# Patient Record
Sex: Male | Born: 1965 | Race: Black or African American | Hispanic: No | Smoking: Former smoker
Health system: Southern US, Community
[De-identification: ages and names within clinical notes are randomized; demographics above are authoritative.]

## PROBLEM LIST (undated history)

## (undated) DIAGNOSIS — K219 Gastro-esophageal reflux disease without esophagitis: Secondary | ICD-10-CM

## (undated) DIAGNOSIS — J329 Chronic sinusitis, unspecified: Secondary | ICD-10-CM

## (undated) DIAGNOSIS — J45909 Unspecified asthma, uncomplicated: Secondary | ICD-10-CM

## (undated) DIAGNOSIS — I10 Essential (primary) hypertension: Secondary | ICD-10-CM

## (undated) DIAGNOSIS — J9600 Acute respiratory failure, unspecified whether with hypoxia or hypercapnia: Secondary | ICD-10-CM

## (undated) DIAGNOSIS — R031 Nonspecific low blood-pressure reading: Secondary | ICD-10-CM

## (undated) DIAGNOSIS — R0602 Shortness of breath: Secondary | ICD-10-CM

## (undated) DIAGNOSIS — Z9889 Other specified postprocedural states: Secondary | ICD-10-CM

## (undated) HISTORY — DX: Chronic sinusitis, unspecified: J32.9

## (undated) HISTORY — PX: KNEE ARTHROSCOPY: SUR90

## (undated) HISTORY — PX: VASECTOMY: SHX75

## (undated) HISTORY — DX: Essential (primary) hypertension: I10

## (undated) HISTORY — DX: Nonspecific low blood-pressure reading: R03.1

## (undated) HISTORY — PX: ANTERIOR CRUCIATE LIGAMENT REPAIR: SHX115

## (undated) HISTORY — DX: Acute respiratory failure, unspecified whether with hypoxia or hypercapnia: J96.00

## (undated) HISTORY — DX: Unspecified asthma, uncomplicated: J45.909

---

## 1996-11-26 HISTORY — PX: NASAL SEPTUM SURGERY: SHX37

## 1998-02-24 ENCOUNTER — Encounter (HOSPITAL_COMMUNITY): Admission: RE | Admit: 1998-02-24 | Discharge: 1998-05-25 | Payer: Self-pay | Admitting: Psychiatry

## 1998-03-11 ENCOUNTER — Emergency Department (HOSPITAL_COMMUNITY): Admission: EM | Admit: 1998-03-11 | Discharge: 1998-03-11 | Payer: Self-pay | Admitting: Emergency Medicine

## 1998-03-13 ENCOUNTER — Emergency Department (HOSPITAL_COMMUNITY): Admission: EM | Admit: 1998-03-13 | Discharge: 1998-03-13 | Payer: Self-pay | Admitting: Emergency Medicine

## 1998-05-04 ENCOUNTER — Emergency Department (HOSPITAL_COMMUNITY): Admission: EM | Admit: 1998-05-04 | Discharge: 1998-05-04 | Payer: Self-pay | Admitting: Emergency Medicine

## 1998-05-12 ENCOUNTER — Emergency Department (HOSPITAL_COMMUNITY): Admission: EM | Admit: 1998-05-12 | Discharge: 1998-05-12 | Payer: Self-pay | Admitting: Emergency Medicine

## 1998-10-18 ENCOUNTER — Emergency Department (HOSPITAL_COMMUNITY): Admission: EM | Admit: 1998-10-18 | Discharge: 1998-10-18 | Payer: Self-pay | Admitting: Emergency Medicine

## 1999-01-04 ENCOUNTER — Emergency Department (HOSPITAL_COMMUNITY): Admission: EM | Admit: 1999-01-04 | Discharge: 1999-01-04 | Payer: Self-pay | Admitting: Emergency Medicine

## 1999-01-18 ENCOUNTER — Emergency Department (HOSPITAL_COMMUNITY): Admission: EM | Admit: 1999-01-18 | Discharge: 1999-01-18 | Payer: Self-pay | Admitting: Emergency Medicine

## 1999-04-06 ENCOUNTER — Emergency Department (HOSPITAL_COMMUNITY): Admission: EM | Admit: 1999-04-06 | Discharge: 1999-04-06 | Payer: Self-pay | Admitting: Emergency Medicine

## 1999-04-06 ENCOUNTER — Encounter: Payer: Self-pay | Admitting: Emergency Medicine

## 1999-08-26 ENCOUNTER — Encounter: Payer: Self-pay | Admitting: Emergency Medicine

## 1999-08-26 ENCOUNTER — Emergency Department (HOSPITAL_COMMUNITY): Admission: EM | Admit: 1999-08-26 | Discharge: 1999-08-26 | Payer: Self-pay | Admitting: Emergency Medicine

## 1999-10-16 ENCOUNTER — Ambulatory Visit: Admission: RE | Admit: 1999-10-16 | Discharge: 1999-10-16 | Payer: Self-pay | Admitting: Pulmonary Disease

## 1999-11-01 ENCOUNTER — Encounter: Payer: Self-pay | Admitting: Emergency Medicine

## 1999-11-01 ENCOUNTER — Inpatient Hospital Stay (HOSPITAL_COMMUNITY): Admission: EM | Admit: 1999-11-01 | Discharge: 1999-11-02 | Payer: Self-pay | Admitting: Emergency Medicine

## 1999-12-01 ENCOUNTER — Inpatient Hospital Stay (HOSPITAL_COMMUNITY): Admission: EM | Admit: 1999-12-01 | Discharge: 1999-12-03 | Payer: Self-pay | Admitting: *Deleted

## 1999-12-01 ENCOUNTER — Encounter: Payer: Self-pay | Admitting: Pulmonary Disease

## 2000-03-30 ENCOUNTER — Emergency Department (HOSPITAL_COMMUNITY): Admission: EM | Admit: 2000-03-30 | Discharge: 2000-03-30 | Payer: Self-pay | Admitting: Emergency Medicine

## 2000-03-30 ENCOUNTER — Encounter: Payer: Self-pay | Admitting: Emergency Medicine

## 2000-08-22 ENCOUNTER — Emergency Department (HOSPITAL_COMMUNITY): Admission: EM | Admit: 2000-08-22 | Discharge: 2000-08-22 | Payer: Self-pay | Admitting: Emergency Medicine

## 2000-08-22 ENCOUNTER — Encounter: Payer: Self-pay | Admitting: Emergency Medicine

## 2000-09-21 ENCOUNTER — Emergency Department (HOSPITAL_COMMUNITY): Admission: EM | Admit: 2000-09-21 | Discharge: 2000-09-21 | Payer: Self-pay | Admitting: Emergency Medicine

## 2000-09-28 ENCOUNTER — Emergency Department (HOSPITAL_COMMUNITY): Admission: EM | Admit: 2000-09-28 | Discharge: 2000-09-28 | Payer: Self-pay | Admitting: Emergency Medicine

## 2001-04-29 ENCOUNTER — Emergency Department (HOSPITAL_COMMUNITY): Admission: EM | Admit: 2001-04-29 | Discharge: 2001-04-30 | Payer: Self-pay | Admitting: Podiatry

## 2001-11-26 HISTORY — PX: NASAL POLYP SURGERY: SHX186

## 2001-12-08 ENCOUNTER — Encounter: Payer: Self-pay | Admitting: Emergency Medicine

## 2001-12-08 ENCOUNTER — Inpatient Hospital Stay (HOSPITAL_COMMUNITY): Admission: EM | Admit: 2001-12-08 | Discharge: 2001-12-11 | Payer: Self-pay | Admitting: Emergency Medicine

## 2001-12-08 ENCOUNTER — Encounter: Payer: Self-pay | Admitting: *Deleted

## 2001-12-09 ENCOUNTER — Encounter: Payer: Self-pay | Admitting: Pulmonary Disease

## 2002-12-15 ENCOUNTER — Encounter: Payer: Self-pay | Admitting: Emergency Medicine

## 2002-12-15 ENCOUNTER — Inpatient Hospital Stay (HOSPITAL_COMMUNITY): Admission: EM | Admit: 2002-12-15 | Discharge: 2002-12-17 | Payer: Self-pay | Admitting: Emergency Medicine

## 2003-01-08 ENCOUNTER — Encounter: Payer: Self-pay | Admitting: Emergency Medicine

## 2003-01-08 ENCOUNTER — Inpatient Hospital Stay (HOSPITAL_COMMUNITY): Admission: EM | Admit: 2003-01-08 | Discharge: 2003-01-10 | Payer: Self-pay | Admitting: Emergency Medicine

## 2003-01-09 ENCOUNTER — Encounter: Payer: Self-pay | Admitting: Pulmonary Disease

## 2003-01-30 ENCOUNTER — Emergency Department (HOSPITAL_COMMUNITY): Admission: EM | Admit: 2003-01-30 | Discharge: 2003-01-31 | Payer: Self-pay | Admitting: Emergency Medicine

## 2003-01-30 ENCOUNTER — Encounter: Payer: Self-pay | Admitting: Emergency Medicine

## 2003-03-23 ENCOUNTER — Encounter: Payer: Self-pay | Admitting: Pulmonary Disease

## 2003-03-23 ENCOUNTER — Inpatient Hospital Stay (HOSPITAL_COMMUNITY): Admission: EM | Admit: 2003-03-23 | Discharge: 2003-03-25 | Payer: Self-pay | Admitting: Emergency Medicine

## 2003-07-06 ENCOUNTER — Encounter: Payer: Self-pay | Admitting: Emergency Medicine

## 2003-07-06 ENCOUNTER — Inpatient Hospital Stay (HOSPITAL_COMMUNITY): Admission: EM | Admit: 2003-07-06 | Discharge: 2003-07-09 | Payer: Self-pay | Admitting: Emergency Medicine

## 2003-09-30 ENCOUNTER — Observation Stay (HOSPITAL_COMMUNITY): Admission: EM | Admit: 2003-09-30 | Discharge: 2003-10-01 | Payer: Self-pay | Admitting: Emergency Medicine

## 2004-02-07 ENCOUNTER — Emergency Department (HOSPITAL_COMMUNITY): Admission: EM | Admit: 2004-02-07 | Discharge: 2004-02-07 | Payer: Self-pay

## 2005-04-16 ENCOUNTER — Observation Stay (HOSPITAL_COMMUNITY): Admission: EM | Admit: 2005-04-16 | Discharge: 2005-04-16 | Payer: Self-pay | Admitting: Emergency Medicine

## 2005-06-12 ENCOUNTER — Encounter: Admission: RE | Admit: 2005-06-12 | Discharge: 2005-06-12 | Payer: Self-pay | Admitting: Otolaryngology

## 2005-10-14 ENCOUNTER — Emergency Department (HOSPITAL_COMMUNITY): Admission: EM | Admit: 2005-10-14 | Discharge: 2005-10-14 | Payer: Self-pay | Admitting: Emergency Medicine

## 2006-11-03 ENCOUNTER — Emergency Department (HOSPITAL_COMMUNITY): Admission: EM | Admit: 2006-11-03 | Discharge: 2006-11-03 | Payer: Self-pay | Admitting: Emergency Medicine

## 2006-11-13 ENCOUNTER — Emergency Department (HOSPITAL_COMMUNITY): Admission: EM | Admit: 2006-11-13 | Discharge: 2006-11-14 | Payer: Self-pay | Admitting: Emergency Medicine

## 2007-01-18 ENCOUNTER — Inpatient Hospital Stay (HOSPITAL_COMMUNITY): Admission: EM | Admit: 2007-01-18 | Discharge: 2007-01-20 | Payer: Self-pay | Admitting: Emergency Medicine

## 2007-08-21 ENCOUNTER — Emergency Department (HOSPITAL_COMMUNITY): Admission: EM | Admit: 2007-08-21 | Discharge: 2007-08-21 | Payer: Self-pay | Admitting: Emergency Medicine

## 2008-06-03 DIAGNOSIS — J45909 Unspecified asthma, uncomplicated: Secondary | ICD-10-CM | POA: Insufficient documentation

## 2008-11-26 DIAGNOSIS — Z9889 Other specified postprocedural states: Secondary | ICD-10-CM

## 2008-11-26 HISTORY — DX: Other specified postprocedural states: Z98.890

## 2009-01-01 ENCOUNTER — Ambulatory Visit: Payer: Self-pay | Admitting: Pulmonary Disease

## 2009-01-01 ENCOUNTER — Inpatient Hospital Stay (HOSPITAL_COMMUNITY): Admission: EM | Admit: 2009-01-01 | Discharge: 2009-01-03 | Payer: Self-pay | Admitting: Emergency Medicine

## 2009-01-18 ENCOUNTER — Ambulatory Visit: Payer: Self-pay | Admitting: Pulmonary Disease

## 2009-01-18 DIAGNOSIS — I10 Essential (primary) hypertension: Secondary | ICD-10-CM | POA: Insufficient documentation

## 2009-01-18 DIAGNOSIS — J329 Chronic sinusitis, unspecified: Secondary | ICD-10-CM | POA: Insufficient documentation

## 2009-01-18 DIAGNOSIS — R031 Nonspecific low blood-pressure reading: Secondary | ICD-10-CM | POA: Insufficient documentation

## 2009-01-18 DIAGNOSIS — J9600 Acute respiratory failure, unspecified whether with hypoxia or hypercapnia: Secondary | ICD-10-CM | POA: Insufficient documentation

## 2009-02-10 ENCOUNTER — Inpatient Hospital Stay (HOSPITAL_COMMUNITY): Admission: EM | Admit: 2009-02-10 | Discharge: 2009-02-14 | Payer: Self-pay | Admitting: Emergency Medicine

## 2009-02-11 ENCOUNTER — Encounter: Payer: Self-pay | Admitting: Pulmonary Disease

## 2009-02-11 ENCOUNTER — Ambulatory Visit: Payer: Self-pay | Admitting: Pulmonary Disease

## 2009-03-07 ENCOUNTER — Ambulatory Visit: Payer: Self-pay | Admitting: Pulmonary Disease

## 2009-03-12 ENCOUNTER — Emergency Department (HOSPITAL_COMMUNITY): Admission: EM | Admit: 2009-03-12 | Discharge: 2009-03-12 | Payer: Self-pay | Admitting: Emergency Medicine

## 2009-03-28 ENCOUNTER — Telehealth (INDEPENDENT_AMBULATORY_CARE_PROVIDER_SITE_OTHER): Payer: Self-pay | Admitting: *Deleted

## 2009-04-07 ENCOUNTER — Telehealth (INDEPENDENT_AMBULATORY_CARE_PROVIDER_SITE_OTHER): Payer: Self-pay | Admitting: *Deleted

## 2009-05-25 ENCOUNTER — Telehealth (INDEPENDENT_AMBULATORY_CARE_PROVIDER_SITE_OTHER): Payer: Self-pay | Admitting: *Deleted

## 2010-01-08 ENCOUNTER — Inpatient Hospital Stay (HOSPITAL_COMMUNITY): Admission: EM | Admit: 2010-01-08 | Discharge: 2010-01-11 | Payer: Self-pay | Admitting: Emergency Medicine

## 2010-01-08 ENCOUNTER — Ambulatory Visit: Payer: Self-pay | Admitting: Emergency Medicine

## 2010-09-20 ENCOUNTER — Emergency Department (HOSPITAL_COMMUNITY)
Admission: EM | Admit: 2010-09-20 | Discharge: 2010-09-20 | Payer: Self-pay | Source: Home / Self Care | Admitting: Emergency Medicine

## 2010-11-12 ENCOUNTER — Emergency Department (HOSPITAL_COMMUNITY)
Admission: EM | Admit: 2010-11-12 | Discharge: 2010-11-12 | Payer: Self-pay | Source: Home / Self Care | Admitting: Emergency Medicine

## 2010-11-12 ENCOUNTER — Inpatient Hospital Stay (HOSPITAL_COMMUNITY)
Admission: EM | Admit: 2010-11-12 | Discharge: 2010-11-16 | Payer: Self-pay | Source: Home / Self Care | Attending: Internal Medicine | Admitting: Internal Medicine

## 2010-12-05 ENCOUNTER — Ambulatory Visit
Admission: RE | Admit: 2010-12-05 | Discharge: 2010-12-05 | Payer: Self-pay | Source: Home / Self Care | Attending: Adult Health | Admitting: Adult Health

## 2010-12-22 ENCOUNTER — Ambulatory Visit
Admission: RE | Admit: 2010-12-22 | Discharge: 2010-12-22 | Payer: Self-pay | Source: Home / Self Care | Attending: Pulmonary Disease | Admitting: Pulmonary Disease

## 2010-12-22 ENCOUNTER — Encounter: Payer: Self-pay | Admitting: Pulmonary Disease

## 2010-12-27 ENCOUNTER — Encounter: Payer: Self-pay | Admitting: Pulmonary Disease

## 2010-12-28 NOTE — Assessment & Plan Note (Addendum)
Summary: per steve/cb   Visit Type:  Follow-up Primary Provider/Referring Provider:  Dr. Almetta Lovely  CC:  Pt c/o chest tightness, increased SOB with activity and at rest, and productive cough with dark yellow mucus.  History of Present Illness: 45 yo African American malefor FU of severe persistent asthma  difficult to control in past  requiring multiple intubations. Asthma Onset age 28 when he worked in a [employer], ETT x 6, once this yr in 2/10, NSAID ( Advil)  incuded, Allergy skin test neg, s/p nasal surgery for polyps & deviated septum, baseline prednisone 10 alternating with 5 mg, BPF > 600  Hospitalised 3/18 - 3/22 for exacerbation ppted by sinus infection CT sinuses - widespread sinus inflammatory disease ? polyp   December 22, 2010 4:02 PM  Lost to FU x 1 yr Was admitted 12/18-12/22 for asthma exacerbation and sinusitis. Tx w/ IV abx, nebs and steroids. He was discharged home on Augmentin and steroid taper. Ffinished round of augmentin from discharge. CT sinus showed bilateral sinusitis. He is feeling better. Was changed from advair to symbicort however he misunderstood and has been taking both . Spiriva was added as well. Rare use of ventolin since discharge.  c/o purulent nasal discharge Denies chest pain, dyspnea, orthopnea, hemoptysis, fever, n/v/d, edema, headache.    Preventive Screening-Counseling & Management  Alcohol-Tobacco     Alcohol drinks/day: socially     Smoking Status: quit     Packs/Day: 0.25     Year Started: 1998     Year Quit: 2000  Current Medications (verified): 1)  Spiriva Handihaler 18 Mcg Caps (Tiotropium Bromide Monohydrate) .... Inhale Contents of 1 Capsule Once A Day 2)  Symbicort 160-4.5 Mcg/act Aero (Budesonide-Formoterol Fumarate) .... Inhale 2 Puffs Two Times A Day 3)  Prednisone 10 Mg Tabs (Prednisone) .... Take 1 Tablet By Mouth Once A Day 4)  Zyrtec Allergy 10 Mg Tabs (Cetirizine Hcl) .Marland Kitchen.. 1 By Mouth At Bedtime 5)  Zantac 150 Mg  Caps (Ranitidine Hcl) .Marland Kitchen.. 1 At Bedtime 6)  Benicar Hct 20-12.5 Mg Tabs (Olmesartan Medoxomil-Hctz) .... Take 1 Tablet By Mouth Once A Day 7)  Flonase 50 Mcg/act Susp (Fluticasone Propionate) .... 2 Sprays Each Nostril Once Daily 8)  Albuterol Sulfate (2.5 Mg/41ml) 0.083% Nebu (Albuterol Sulfate) .Marland Kitchen.. 1 Vial Up To Twice A Day As Needed For  Wheezing or Shortness of Breath 9)  Ventolin Hfa 108 (90 Base) Mcg/act Aers (Albuterol Sulfate) .... 2 Puffs Every 4 Hr As Needed Wheezing  Allergies: 1)  ! Nsaids 2)  ! 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  <1ppd. social alcohol   Social History: Packs/Day:  0.25 Alcohol drinks/day:  socially  Review of Systems  The patient denies anorexia, fever, weight loss, weight gain, vision loss, decreased hearing, hoarseness, chest pain, syncope, dyspnea on exertion, peripheral edema, prolonged cough, headaches, hemoptysis, abdominal pain, melena, hematochezia, severe indigestion/heartburn, hematuria, muscle weakness, suspicious skin lesions, transient blindness, difficulty walking, depression, unusual weight change, abnormal bleeding, enlarged lymph nodes, and angioedema.    Vital Signs:  Patient profile:   45 year old male Height:      71 inches Weight:      183.8 pounds BMI:  25.73 O2 Sat:      96 % on Room air Temp:     98.1 degrees F oral Pulse rate:   74 / minute BP sitting:   142 / 90  (left arm) Cuff size:   regular  Vitals Entered By: Zackery Barefoot CMA (December 22, 2010 3:29 PM)  O2 Flow:  Room air CC: Pt c/o chest tightness, increased SOB with  activity and at rest, productive cough with dark yellow mucus Comments Medications reviewed with patient Verified contact number and pharmacy with patient Zackery Barefoot CMA  December 22, 2010 3:29 PM    Physical Exam  Additional Exam:  Gen. Pleasant, well-nourished, in no distress ENT - no lesions,  post nasal drip Neck: No JVD, no thyromegaly, no carotid bruits Lungs: no use of accessory muscles, no dullness to percussion, clear without rales or rhonchi  Cardiovascular: Rhythm regular, heart sounds  normal, no murmurs or gallops, no peripheral edema Musculoskeletal: No deformities, no cyanosis or clubbing      Impression & Recommendations:  Problem # 1:  SINUSITIS, CHRONIC (ICD-473.9) Augmentin x 7ds  Problem # 2:  ASTHMA, PERSISTENT, SEVERE (ICD-493.90) On maximal therapy. Will add xolair depending on IgE levels - biggest concern here is his non compliance Also singulair seems to have fallen off his med list  - will renew  Medications Added to Medication List This Visit: 1)  Augmentin 875-125 Mg Tabs (Amoxicillin-pot clavulanate) .... Two times a day 2)  Singulair 10 Mg Tabs (Montelukast sodium) .... Once daily  Other Orders: Est. Patient Level IV (16109) T-"RAST" (Allergy Full Profile) IGE ([id number])  Patient Instructions: 1)  Copy sent to: Dr Renae Gloss 2)  Please schedule a follow-up appointment in 1 month. 3)  Antibiotic x 7dsBlood owrk in anticipation of strating xolair 4)  We discussed goals for your asthma therapy Prescriptions: SINGULAIR 10 MG TABS (MONTELUKAST SODIUM) once daily  #30 x 3   Entered and Authorized by:   Comer Locket [name] MD   Signed by:   Comer Locket [name] MD on 12/23/2010   Method used:   [facility]        [facility]  [address] [phone]* (retail)       [address]       [place], [place]  [id number]       Ph: [phone] or [phone]       Fax: [phone]   RxID:   [id number] AUGMENTIN 875-125 MG TABS  (AMOXICILLIN-POT CLAVULANATE) two times a day  #14 x 0   Entered and Authorized by:   Comer Locket. [name] MD   Signed by:   Comer Locket [name] MD on 12/22/2010   Method used:   [facility]        [facility]  [address] [phone]* (retail)       [address]       [place], [place]  [id number]       Ph: [phone] or [phone]       Fax: [phone]   RxID:   [id number]    Immunization History:  Pneumovax Immunization History:    Pneumovax:  historical (11/28/2009)    Appended Document: per steve/cb singulair seems to have fallen off his med list for some reason. I have sent Rx & would like him to get back on this, pl let him know  Appended Document: per steve/cb lmomtcb x 1  Appended Document: per steve/cb pt states he is not on Singulair, pt informed [facility]. Pt would like to know results of labs and if he needs to start Xolair.  Appended Document: Orders Update start xolair 300 mg q 4 weeks Subcutaneously - let him know order sent , await insurance approval   Clinical Lists Changes  Orders: Added new Referral order of Misc. Referral (Misc. Ref) - Signed

## 2010-12-28 NOTE — Assessment & Plan Note (Signed)
Summary: NP follow up - post hosp   Primary Provider/Referring Provider:  Dr. Almetta Lovely  CC:  post hosp follow up - states breathing is doing well since discharge.  History of Present Illness: Pt is a 45 yo African American malefor FU of severe persistent asthma  difficult to control in past  requiring multiple intubations. Asthma Onset age 72 when he worked in a [employer], ETT x 6, once this yr in 2/10, NSAID ( Advil)  incuded, Allergy skin test neg, s/p nasal surgery for polyps & deviated septum, baseline prednisone 10 alternating with 5 mg, BPF > 600  Hospitalised 3/18 - 3/22 for exacerbation ppted by sinus infection CT sinuses - widespread sinus inflammatory disease ? polyp   Denies chest pain, dyspnea, orthopnea, hemoptysis, fever, n/v/d, edema, headache. Using albutreol inhaler <2x/wk.   December 05, 2010 --Presents for post hosp follow up -States breathing is doing well since discharge.  Was admitted 12/18-12/22 for asthma exacerbation and sinusitis. Tx w/ IV abx, nebs and steroids. He was discharged home on Augmentin and steroid taper. Ffinished round of augmentin from discharge. CT sinus showed bilateral sinusitis. He is feeling better. Was changed from advair to symbicort however he misunderstood and has been taking both . Spiriva was added as well. Rare use of ventolin since discharge. Denies chest pain, dyspnea, orthopnea, hemoptysis, fever, n/v/d, edema, headache.    Medications Prior to Update: 1)  Singulair 10 Mg  Tabs (Montelukast Sodium) .... Once Daily 2)  Prednisone 10 Mg Tabs (Prednisone) .... Take 1 Tablet By Mouth Once A Day 3)  Zantac 150 Mg Caps (Ranitidine Hcl) .Marland Kitchen.. 1 Once Daily As Needed 4)  Benicar Hct 20-12.5 Mg Tabs (Olmesartan Medoxomil-Hctz) .... Take 1 Tablet By Mouth Once A Day 5)  Albuterol Sulfate (2.5 Mg/7ml) 0.083% Nebu (Albuterol Sulfate) .Marland Kitchen.. 1 Vial Up To Twice A Day As Needed For Sob 6)  Flonase 50 Mcg/act Susp (Fluticasone Propionate) .... 2  Sprays Each Nostril Once Daily 7)  Proair Hfa 108 (90 Base) Mcg/act Aers (Albuterol Sulfate) .... Inhale 2 Puffs Every Four Hours As Needed  Current Medications (verified): 1)  Prednisone 10 Mg Tabs (Prednisone) .... Take 1 Tablet By Mouth Once A Day 2)  Proair Hfa 108 (90 Base) Mcg/act Aers (Albuterol Sulfate) .... Inhale 2 Puffs Every Four Hours As Needed 3)  Benicar Hct 20-12.5 Mg Tabs (Olmesartan Medoxomil-Hctz) .... Take 1 Tablet By Mouth Once A Day 4)  Flonase 50 Mcg/act Susp (Fluticasone Propionate) .... 2 Sprays Each Nostril Once Daily 5)  Zantac 150 Mg Caps (Ranitidine Hcl) .Marland Kitchen.. 1 Once Daily As Needed 6)  Albuterol Sulfate (2.5 Mg/50ml) 0.083% Nebu (Albuterol Sulfate) .Marland Kitchen.. 1 Vial Up To Twice A Day As Needed For Sob 7)  Spiriva Handihaler 18 Mcg Caps (Tiotropium Bromide Monohydrate) .... Inhale Contents of 1 Capsule Once A Day 8)  Advair Diskus 500-50 Mcg/dose Aepb (Fluticasone-Salmeterol) .... Inhale 1 Puff Two Times A Day 9)  Symbicort 160-4.5 Mcg/act Aero (Budesonide-Formoterol Fumarate) .... Inhale 2 Puffs Two Times A Day  Allergies (verified): 1)  ! 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  <1ppd. 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  <1ppd. social alcohol   Review of Systems      See HPI  Vital Signs:  Patient profile:   45 year old male Height:      71 inches Weight:      184.25 pounds BMI:     25.79 O2 Sat:      94 % on Room air Temp:     97.9 degrees F oral Pulse rate:   89 / minute BP sitting:   122 / 88  (right arm) Cuff size:   regular  Vitals Entered By: Boone Master CNA/MA (December 05, 2010 3:08 PM)  O2 Flow:  Room air CC: post hosp follow up - states breathing is doing well since discharge Is Patient Diabetic? No Comments Medications reviewed with patient Daytime contact number verified with patient. Boone Master CNA/MA  December 05, 2010 3:09 PM    Physical Exam  Additional Exam:  Gen. Pleasant, well-nourished, in no distress ENT - no lesions,  post nasal drip Neck: No JVD, no thyromegaly, no carotid bruits Lungs: no use of accessory muscles, no dullness to percussion, clear without rales or rhonchi  Cardiovascular: Rhythm regular, heart sounds  normal, no murmurs or gallops, no peripheral edema Musculoskeletal: No deformities, no cyanosis or clubbing      Impression & Recommendations:  Problem # 1:  ASTHMA, PERSISTENT, SEVERE (ICD-493.90)  w/ associated sinusitis -now improving s/p hospitlization  Plan :  follow up Dr. [name] in 3 weeks w/ PFTs  Stop Advair  Continue on Symbicort 160/4.61mcg 2 puffs two times a day  Continue on Spiriva 1 puff once daily  Brush/rinse and garlge after inhaler use.  Saline nasal rinses as needed  Omeprazole 20mg  once daily  Zantac 150mg  at bedtime  Mucinex  two times a day as needed  Add Zyrtec 10mg  at bedtime  Prendisone 10mg  once daily for 1week then 1/2 tab once daily and hold until seen back in office.  Please contact  office for sooner follow up if symptoms do not improve or worsen  His updated medication list for this problem includes:    Benicar Hct 20-12.5 Mg Tabs (Olmesartan medoxomil-hctz) .Marland Kitchen... Take 1 tablet by mouth once a day  Orders: Est. Patient Level III (16109)  Medications Added to Medication List This Visit: 1)  Spiriva Handihaler 18 Mcg Caps (Tiotropium bromide monohydrate) .... Inhale contents of 1 capsule once a day 2)  Symbicort 160-4.5 Mcg/act Aero (Budesonide-formoterol fumarate) .... Inhale 2 puffs two times a day 3)  Zyrtec Allergy 10 Mg Tabs (Cetirizine hcl) .Marland Kitchen.. 1 by mouth at bedtime 4)  Omeprazole 20 Mg Cpdr (Omeprazole) .Marland Kitchen.. 1 by mouth once daily 5)  Zantac 150 Mg Caps (Ranitidine hcl) .Marland Kitchen.. 1 at bedtime 6)  Albuterol Sulfate (2.5 Mg/75ml) 0.083% Nebu (Albuterol sulfate) .Marland Kitchen.. 1 vial up to twice a day as needed for  wheezing or shortness of breath 7)  Ventolin Hfa 108 (90 Base) Mcg/act Aers (Albuterol sulfate) .... 2 puffs every 4 hr  as needed wheezing  Patient Instructions: 1)  follow up Dr. [name] in 3 weeks w/ PFTs  2)  Stop Advair  3)  Continue on Symbicort 160/4.37mcg 2 puffs two times a day  4)  Continue on Spiriva 1 puff once daily  5)  Brush/rinse and garlge after inhaler use.  6)  Saline nasal rinses as needed  7)  Omeprazole 20mg  once daily  8)  Zantac 150mg  at bedtime  9)  Mucinex  two times a day as needed  10)  Add Zyrtec 10mg  at bedtime  11)  Prendisone 10mg  once daily for 1week then 1/2 tab once daily and hold until seen back in office.  12)  Please contact office for sooner follow up if symptoms do not improve or worsen    Immunization History:  Influenza Immunization History:    Influenza:  historical (09/26/2010)

## 2011-01-01 ENCOUNTER — Encounter: Payer: Self-pay | Admitting: Pulmonary Disease

## 2011-01-01 ENCOUNTER — Ambulatory Visit (INDEPENDENT_AMBULATORY_CARE_PROVIDER_SITE_OTHER): Payer: BC Managed Care – PPO | Admitting: Pulmonary Disease

## 2011-01-01 DIAGNOSIS — J45909 Unspecified asthma, uncomplicated: Secondary | ICD-10-CM

## 2011-01-08 ENCOUNTER — Telehealth (INDEPENDENT_AMBULATORY_CARE_PROVIDER_SITE_OTHER): Payer: Self-pay | Admitting: *Deleted

## 2011-01-10 ENCOUNTER — Encounter: Payer: Self-pay | Admitting: Pulmonary Disease

## 2011-01-11 NOTE — Assessment & Plan Note (Signed)
Summary: 3 WEEK FOLLOW UP   Visit Type:  Follow-up Primary Provider/Referring Provider:  Dr. Almetta Lovely  CC:  Pt c/o sinus pressure and headache, PND, and productive cough with dark green/yellow mucus "looked old" this weekend. Was taking OTC cold and sinus medication with relief.  History of Present Illness: 45 yo African American malefor FU of severe persistent asthma  difficult to control in past  requiring multiple intubations. Asthma Onset age 56 when he worked in a [employer], ETT x 6, once this yr in 2/10, NSAID ( Advil)  incuded, Allergy skin test neg, s/p nasal surgery for polyps & deviated septum, baseline prednisone 10 alternating with 5 mg, BPF > 600 CT sinuses 3/10 - widespread sinus inflammatory disease ? polyp   December 22, 2010 4:02 PM  Lost to FU x 1 yr Was admitted 12/18-12/22 for asthma exacerbation and sinusitis. Tx w/ IV abx, nebs and steroids. He was discharged home on Augmentin and steroid taper. Ffinished round of augmentin from discharge. CT sinus showed bilateral sinusitis. He is feeling better. Was changed from advair to symbicort however he misunderstood and has been taking both . Spiriva was added as well. Rare use of ventolin since discharge.  c/o purulent nasal discharge  January 01, 2011 4:10 PM  c/o sinus pressure  - improved with Abx  RAST revieed IgE 183, discussed xolair - goals of therapy & side effects Denies chest pain, dyspnea, orthopnea, hemoptysis, fever, n/v/d, edema, headache.    Preventive Screening-Counseling & Management  Alcohol-Tobacco     Alcohol drinks/day: socially     Smoking Status: quit     Packs/Day: 0.25     Year Started: 1998     Year Quit: 2000  Allergies: 1)  ! Nsaids 2)  ! 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  <1ppd. social alcohol   Review of Systems       The patient complains of headaches.  The patient denies anorexia, fever, weight loss, weight gain, vision loss, decreased hearing, hoarseness, chest pain, syncope, dyspnea on exertion, peripheral edema, prolonged cough, hemoptysis, abdominal pain, melena, hematochezia, severe indigestion/heartburn, hematuria, muscle weakness, suspicious skin lesions, difficulty walking, depression, unusual weight change, abnormal bleeding, enlarged lymph nodes, and angioedema.    Vital Signs:  Patient profile:   45 year old male Height:      71 inches Weight:      182.4 pounds BMI:     25.53 O2 Sat:      97 % on Room air Temp:     98.1 degrees F oral Pulse rate:   71 / minute BP sitting:   160 / 110  (left arm) Cuff size:   large  Vitals Entered By: Zackery Barefoot CMA (January 01, 2011 3:57 PM)  O2 Flow:  Room air CC: Pt c/o sinus pressure and headache, PND,  productive cough with dark green/yellow mucus "looked old" this weekend. Was taking OTC cold and sinus medication with relief Comments Medications reviewed with patient Verified contact number and pharmacy with patient Zackery Barefoot CMA  January 01, 2011 3:59 PM    Physical Exam  Additional Exam:  Gen. Pleasant, well-nourished, in no distress ENT -  no lesions,  post nasal drip Neck: No JVD, no thyromegaly, no carotid bruits Lungs: no use of accessory muscles, no dullness to percussion, clear without rales or rhonchi  Cardiovascular: Rhythm regular, heart sounds  normal, no murmurs or gallops, no peripheral edema Musculoskeletal: No deformities, no cyanosis or clubbing      Impression & Recommendations:  Problem # 1:  ASTHMA, PERSISTENT, SEVERE (ICD-493.90) Start on xolair - q 4 wk dosing, await  insurance approval OK to stop spiriva OTC decongestatnt STAY on symbicort 160/4.5 2 puffs two times a day   & pred 10 m once daily  Goals will be to prevent hospitalisation & perhaps decrease prednisone if well controlled in the future.  Other Orders: Est. Patient Level III (30865)  Patient Instructions: 1)  Copy sent to: Andi Devon 2)  Start on xolair 3)  OK to stop spiriva 4)  OTC decongestatnt 5)  STAY on symbicort 160 & pred 10

## 2011-01-15 ENCOUNTER — Encounter: Payer: Self-pay | Admitting: Internal Medicine

## 2011-01-15 ENCOUNTER — Encounter: Payer: Self-pay | Admitting: Pulmonary Disease

## 2011-01-15 ENCOUNTER — Institutional Professional Consult (permissible substitution) (INDEPENDENT_AMBULATORY_CARE_PROVIDER_SITE_OTHER): Payer: BC Managed Care – PPO | Admitting: Internal Medicine

## 2011-01-15 DIAGNOSIS — J45909 Unspecified asthma, uncomplicated: Secondary | ICD-10-CM

## 2011-01-15 DIAGNOSIS — J329 Chronic sinusitis, unspecified: Secondary | ICD-10-CM

## 2011-01-15 DIAGNOSIS — J330 Polyp of nasal cavity: Secondary | ICD-10-CM

## 2011-01-15 DIAGNOSIS — J310 Chronic rhinitis: Secondary | ICD-10-CM | POA: Insufficient documentation

## 2011-01-17 NOTE — Progress Notes (Signed)
Summary: prescription for xolair---90 day supply  Phone Note From Pharmacy   Caller: [facility] Call For: [name]  Summary of Call: Inetta Fermo with [facility] phoned regarding a prescription for Mr [name]. Please call [phone] option 2 reference number is [id number].  they need clarification they received a prescription but they have a question regarding the prescription Initial call taken by: Vedia Coffer,  January 08, 2011 1:37 PM  Follow-up for Phone Call        called and spoke with Inetta Fermo from [facility].  Inetta Fermo just wanted to know if pt can get a 90 day supply of Xolair instead of 30 day supply. [facility] Bad for 90 day supply x 1 additional refills.  Arman Filter LPN  January 08, 2011 4:09 PM

## 2011-01-22 ENCOUNTER — Ambulatory Visit: Payer: Self-pay | Admitting: Pulmonary Disease

## 2011-01-22 ENCOUNTER — Telehealth (INDEPENDENT_AMBULATORY_CARE_PROVIDER_SITE_OTHER): Payer: Self-pay | Admitting: *Deleted

## 2011-01-23 NOTE — Medication Information (Signed)
Summary: Prior Auth/[facility]  Prior Auth/[facility]   Imported By: Lester Lowndes 01/17/2011 10:28:45  _____________________________________________________________________  External Attachment:    Type:   Image     Comment:   External Document

## 2011-01-23 NOTE — Assessment & Plan Note (Signed)
Summary: ALLERGY CONSULT PER RA/KCW   Vital Signs:  Patient profile:   45 year old male Height:      71 inches Weight:      180.50 pounds BMI:     25.27 O2 Sat:      96 % on Room air Pulse rate:   88 / minute BP sitting:   136 / 82  (left arm) Cuff size:   regular  Vitals Entered By: Reynaldo Minium CMA (January 15, 2011 3:05 PM)  O2 Flow:  Room air CC: Allergy Consult-Dr. Cyril Mourning   Primary Provider/Referring Provider:  Dr. Almetta Lovely  CC:  Allergy Consult-Dr. Cyril Mourning.  History of Present Illness: January 15, 2011- 44 yoM followed for asthma by Dr [name] and referred for allergy evaluation, considering Xolair, but needing documentation to qualify Chronic asthma as an adult, dx'd 1995, smoked only briefly. On prednisone repeatedly since 1995, now at 10 mg daily, finishing taper. IgE was 183 12/22/10 on in vitro testing, with no specific elevations. . Says allergy skin testing in [facility] not strongly positive.  Triggers- virus infections dominate. Hx Asthma/ nasal polyps/ Asthma Triad. Hx sinus surgery x 3 for nasal polyps. Treated for sinusitis with augmentin 12/22/10. Denies hx skin rash/ urticaria We reviewed his medication list. He took Zyrtec last night. Continues Flonase, Symbicort, Ventolin HFA, Singulair and has nebulizer.   Asthma History    Initial Asthma Severity Rating:    Age range: 12+ years    Symptoms: 0-2 days/week    Nighttime Awakenings: 0-2/month    Interferes w/ normal activity: no limitations    SABA use (not for EIB): >2 days/week but not >1X/day    Asthma Severity Assessment: Mild Persistent   Preventive Screening-Counseling & Management  Alcohol-Tobacco     Alcohol drinks/day: socially     Smoking Status: quit     Packs/Day: 0.25     Year Started: 1998     Year Quit: 2000  Current Medications (verified): 1)  Symbicort 160-4.5 Mcg/act Aero (Budesonide-Formoterol Fumarate) .... Inhale 2 Puffs Two Times A Day 2)  Zyrtec Allergy 10  Mg Tabs (Cetirizine Hcl) .Marland Kitchen.. 1 By Mouth At Bedtime 3)  Zantac 150 Mg Caps (Ranitidine Hcl) .Marland Kitchen.. 1 At Bedtime 4)  Benicar Hct 20-12.5 Mg Tabs (Olmesartan Medoxomil-Hctz) .... Take 1 Tablet By Mouth Once A Day 5)  Flonase 50 Mcg/act Susp (Fluticasone Propionate) .... 2 Sprays Each Nostril Once Daily 6)  Albuterol Sulfate (2.5 Mg/44ml) 0.083% Nebu (Albuterol Sulfate) .Marland Kitchen.. 1 Vial Up To Twice A Day As Needed For  Wheezing or Shortness of Breath 7)  Ventolin Hfa 108 (90 Base) Mcg/act Aers (Albuterol Sulfate) .... 2 Puffs Every 4 Hr As Needed Wheezing 8)  Singulair 10 Mg Tabs (Montelukast Sodium) .... Once Daily  Allergies (verified): 1)  ! Nsaids 2)  ! 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  <1ppd. 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  The patient denies non-productive cough, coughing up blood, chest pain, irregular heartbeats, indigestion, loss of appetite, weight  change, abdominal pain, difficulty swallowing, tooth/dental problems, ear ache, anxiety, depression, hand/feet swelling, joint stiffness or pain, rash, change in color of mucus, and fever.    Physical Exam  Additional Exam:  General: A/Ox3; pleasant and cooperative, NAD, SKIN: no rash, lesions NODES: no lymphadenopathy HEENT: [place]/AT, EOM- WNL, Conjuctivae- clear, PERRLA, TM-WNL, Nose- question minor polyp under left superior turbinate Throat- clear and wnl, Mallampati  II, hoarse, no stridor NECK: Supple w/ fair ROM, JVD- none, normal carotid impulses w/o bruits Thyroid- normal to palpation CHEST: loose cough, w/o rhonchi, rales or dullness HEART: RRR, no m/g/r heard ABDOMEN: Soft and nl; nml bowel sounds; no organomegaly or masses noted EAV:WUJW, nl pulses, no edema  NEURO: Grossly intact to observation      Impression & Recommendations:  Problem # 1:  SINUSITIS, CHRONIC (ICD-473.9) He would like ENT referral about the recurrent / chronic sinusitis w/ hx nasal polyposis., Given neosynephrine nasal neb ttreatment before exam today  Problem # 2:  ASTHMA, PERSISTENT, 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 He has elevated IgE level on in vitro testing, but no specific target allergens identified. He does recognize importance of viral trigger for his asthma  as well. He has been steroid dependent for years.  He has a bronchitis now with green mucus and will give him Avelox this time.  We will bring him back for allergy testing, with specific instruction to be off antihistamines.   Problem # 3:  NASAL POLYP (ICD-471.0) Known [place] Tri[facility] association of his asthma with recurrent polypsa dn aspirin sensitivity. Referral to ENT and avoidc=ance of NSAIDs.  Medications Added to Medication List This Visit: 1)  Avelox 400 Mg Tabs (Moxifloxacin hcl) .Marland Kitchen.. 1 daily 2)  Nasalcrom 5.2 Mg/act Aers (Cromolyn sodium) .Marland Kitchen.. 1 spray each nostril  four times a day  Other Orders: Consultation Level IV (11914) ENT Referral (ENT) Nebulizer Tx (78295)  Patient Instructions: 1)  Return as able for allergy skin testing- Stop all antihistamines 3 days before skin testing, including cold and allergy meds, otc sleep and cough meds. This includes Zyrtec. 2)  It will be ok to continue your sprays and inhalers 3)  Try otc Nasalcrom/cromolyn nasal spray - directions on box 4)  Script for avelox for current sinusitis/ bronchitis 5)  See [facility] for referral to ENT Prescriptions: AVELOX 400 MG TABS (MOXIFLOXACIN HCL) 1 daily  #10 x 0   Entered and Authorized by:   Waymon Budge MD   Signed by:   Waymon Budge MD on 01/15/2011   Method used:   Print then Give to Patient   RxID:   [id number]    Medication Administration  Medication # 1:    Medication: EMR miscellaneous medications    Diagnosis: SINUSITIS, CHRONIC (ICD-473.9)    Dose: 3 drops     Route: intranasal    Exp Date: 08-2012    Lot #: [id number]    Mfr: Novartis    Comments: [address] Fast Acting    Patient  tolerated medication without complications    Given by: Vivianne Spence  Orders Added: 1)  Consultation Level IV [04540] 2)  ENT Referral [ENT] 3)  Nebulizer Tx [phone]

## 2011-01-23 NOTE — Medication Information (Signed)
Summary: Xolair approved/[employer]  Xolair approved/[employer]   Imported By: Sherian Rein 01/18/2011 15:06:28  _____________________________________________________________________  External Attachment:    Type:   Image     Comment:   External Document

## 2011-01-31 ENCOUNTER — Ambulatory Visit (INDEPENDENT_AMBULATORY_CARE_PROVIDER_SITE_OTHER): Payer: BC Managed Care – PPO | Admitting: Internal Medicine

## 2011-01-31 ENCOUNTER — Encounter: Payer: Self-pay | Admitting: Internal Medicine

## 2011-01-31 DIAGNOSIS — J45909 Unspecified asthma, uncomplicated: Secondary | ICD-10-CM

## 2011-01-31 DIAGNOSIS — J330 Polyp of nasal cavity: Secondary | ICD-10-CM

## 2011-01-31 DIAGNOSIS — J310 Chronic rhinitis: Secondary | ICD-10-CM

## 2011-02-01 NOTE — Progress Notes (Signed)
Summary: pred rx   Phone Note Call from Patient   Caller: Patient Call For: alva Summary of Call: Patient phoned stated that he needs a refill on his prednisone 20mg  [facility] [address]. Patient can be reached at [phone] Initial call taken by: Vedia Coffer,  January 22, 2011 3:48 PM  Follow-up for Phone Call        Called, spoke with pt.  He is requesting prednisone 20mg  take 1/2 tablet once daily.  He is out.  However, this is not on current med list.  RA, pls advise if ok to send.  Thanks! Gweneth Dimitri RN  January 22, 2011 5:08 PM   Additional Follow-up for Phone Call Additional follow up Details #1::        yes, let him kow 10 mg pills sent Additional Follow-up by: Comer Locket. [name] MD,  January 22, 2011 7:59 PM    Additional Follow-up for Phone Call Additional follow up Details #2::    Called, spoke with pt.  He is aware prednisone 10mg  tablets sent to pharm.  He verbalized understanding of this. Follow-up by: Gweneth Dimitri RN,  January 23, 2011 8:59 AM  New/Updated Medications: PREDNISONE 10 MG TABS (PREDNISONE) once daily Prescriptions: PREDNISONE 10 MG TABS (PREDNISONE) once daily  #60 x 1   Entered and Authorized by:   Comer Locket. [name] MD   Signed by:   Comer Locket [name] MD on 01/22/2011   Method used:   [facility]  [address] [phone]       [address]       [place], [place]  [id number]       Ph: [phone] or [phone]       Fax: [phone]   RxID:   [id number]

## 2011-02-02 ENCOUNTER — Observation Stay (HOSPITAL_COMMUNITY)
Admission: EM | Admit: 2011-02-02 | Discharge: 2011-02-04 | DRG: 588 | Disposition: A | Discharge status: Home / Self Care | Payer: BC Managed Care – PPO | Attending: Internal Medicine | Admitting: Internal Medicine

## 2011-02-02 ENCOUNTER — Emergency Department (HOSPITAL_COMMUNITY): Payer: BC Managed Care – PPO

## 2011-02-02 DIAGNOSIS — Z79899 Other long term (current) drug therapy: Secondary | ICD-10-CM | POA: Insufficient documentation

## 2011-02-02 DIAGNOSIS — J9620 Acute and chronic respiratory failure, unspecified whether with hypoxia or hypercapnia: Principal | ICD-10-CM | POA: Insufficient documentation

## 2011-02-02 DIAGNOSIS — R000 Tachycardia, unspecified: Secondary | ICD-10-CM | POA: Insufficient documentation

## 2011-02-02 DIAGNOSIS — J45901 Unspecified asthma with (acute) exacerbation: Secondary | ICD-10-CM | POA: Insufficient documentation

## 2011-02-02 DIAGNOSIS — K219 Gastro-esophageal reflux disease without esophagitis: Secondary | ICD-10-CM | POA: Insufficient documentation

## 2011-02-02 DIAGNOSIS — J45902 Unspecified asthma with status asthmaticus: Secondary | ICD-10-CM

## 2011-02-02 DIAGNOSIS — K5289 Other specified noninfective gastroenteritis and colitis: Secondary | ICD-10-CM | POA: Insufficient documentation

## 2011-02-02 DIAGNOSIS — R197 Diarrhea, unspecified: Secondary | ICD-10-CM | POA: Insufficient documentation

## 2011-02-02 DIAGNOSIS — I10 Essential (primary) hypertension: Secondary | ICD-10-CM | POA: Insufficient documentation

## 2011-02-02 DIAGNOSIS — R112 Nausea with vomiting, unspecified: Secondary | ICD-10-CM | POA: Insufficient documentation

## 2011-02-02 DIAGNOSIS — Z87891 Personal history of nicotine dependence: Secondary | ICD-10-CM | POA: Insufficient documentation

## 2011-02-02 DIAGNOSIS — J329 Chronic sinusitis, unspecified: Secondary | ICD-10-CM | POA: Insufficient documentation

## 2011-02-02 LAB — CARDIAC PANEL(CRET KIN+CKTOT+MB+TROPI)
Relative Index: INVALID (ref 0.0–2.5)
Total CK: 88 U/L (ref 7–232)

## 2011-02-02 LAB — DIFFERENTIAL
Eosinophils Absolute: 0.3 10*3/uL (ref 0.0–0.7)
Lymphs Abs: 2.4 10*3/uL (ref 0.7–4.0)
Monocytes Relative: 9 % (ref 3–12)
Neutrophils Relative %: 73 % (ref 43–77)

## 2011-02-02 LAB — CBC
HCT: 48.9 % (ref 39.0–52.0)
Hemoglobin: 16 g/dL (ref 13.0–17.0)
MCH: 28.1 pg (ref 26.0–34.0)
MCV: 85.8 fL (ref 78.0–100.0)
Platelets: 232 10*3/uL (ref 150–400)
RBC: 5.7 MIL/uL (ref 4.22–5.81)

## 2011-02-02 LAB — RAPID URINE DRUG SCREEN, HOSP PERFORMED
Amphetamines: NOT DETECTED
Opiates: NOT DETECTED
Tetrahydrocannabinol: NOT DETECTED

## 2011-02-02 LAB — CK TOTAL AND CKMB (NOT AT ARMC)
Relative Index: INVALID (ref 0.0–2.5)
Total CK: 95 U/L (ref 7–232)

## 2011-02-02 LAB — BASIC METABOLIC PANEL
BUN: 17 mg/dL (ref 6–23)
CO2: 28 mEq/L (ref 19–32)
Chloride: 106 mEq/L (ref 96–112)
Creatinine, Ser: 1.21 mg/dL (ref 0.4–1.5)
Glucose, Bld: 88 mg/dL (ref 70–99)

## 2011-02-03 ENCOUNTER — Inpatient Hospital Stay (HOSPITAL_COMMUNITY): Payer: BC Managed Care – PPO

## 2011-02-03 LAB — BASIC METABOLIC PANEL
BUN: 15 mg/dL (ref 6–23)
Chloride: 108 mEq/L (ref 96–112)
Potassium: 4 mEq/L (ref 3.5–5.1)
Sodium: 139 mEq/L (ref 135–145)

## 2011-02-03 LAB — CBC
MCV: 85.7 fL (ref 78.0–100.0)
Platelets: 198 10*3/uL (ref 150–400)
RBC: 4.77 MIL/uL (ref 4.22–5.81)
WBC: 8.5 10*3/uL (ref 4.0–10.5)

## 2011-02-03 LAB — CARDIAC PANEL(CRET KIN+CKTOT+MB+TROPI)
CK, MB: 1.7 ng/mL (ref 0.3–4.0)
Relative Index: INVALID (ref 0.0–2.5)

## 2011-02-04 LAB — BASIC METABOLIC PANEL
CO2: 25 mEq/L (ref 19–32)
Chloride: 107 mEq/L (ref 96–112)
Creatinine, Ser: 1.17 mg/dL (ref 0.4–1.5)
GFR calc Af Amer: >60 mL/min (ref >60)
Potassium: 3.7 mEq/L (ref 3.5–5.1)
Sodium: 139 mEq/L (ref 135–145)

## 2011-02-05 LAB — CBC
Hemoglobin: 13.6 g/dL (ref 13.0–17.0)
MCH: 28.3 pg (ref 26.0–34.0)
MCV: 87.1 fL (ref 78.0–100.0)
Platelets: 235 10*3/uL (ref 150–400)
RBC: 4.81 MIL/uL (ref 4.22–5.81)
RBC: 5.3 MIL/uL (ref 4.22–5.81)
RDW: 13.3 % (ref 11.5–15.5)
WBC: 10.6 10*3/uL — ABNORMAL HIGH (ref 4.0–10.5)

## 2011-02-05 LAB — POCT I-STAT, CHEM 8
BUN: 14 mg/dL (ref 6–23)
Creatinine, Ser: 1.1 mg/dL (ref 0.4–1.5)
Hemoglobin: 16.7 g/dL (ref 13.0–17.0)
Potassium: 4.5 mEq/L (ref 3.5–5.1)
Sodium: 145 mEq/L (ref 135–145)
TCO2: 31 mmol/L (ref 0–100)

## 2011-02-05 LAB — GLUCOSE, CAPILLARY
Glucose-Capillary: 104 mg/dL — ABNORMAL HIGH (ref 70–99)
Glucose-Capillary: 114 mg/dL — ABNORMAL HIGH (ref 70–99)
Glucose-Capillary: 133 mg/dL — ABNORMAL HIGH (ref 70–99)
Glucose-Capillary: 135 mg/dL — ABNORMAL HIGH (ref 70–99)
Glucose-Capillary: 139 mg/dL — ABNORMAL HIGH (ref 70–99)
Glucose-Capillary: 139 mg/dL — ABNORMAL HIGH (ref 70–99)
Glucose-Capillary: 151 mg/dL — ABNORMAL HIGH (ref 70–99)
Glucose-Capillary: 87 mg/dL (ref 70–99)

## 2011-02-05 LAB — DIFFERENTIAL
Basophils Absolute: 0 10*3/uL (ref 0.0–0.1)
Lymphocytes Relative: 7 % — ABNORMAL LOW (ref 12–46)
Lymphs Abs: 0.7 10*3/uL (ref 0.7–4.0)
Neutrophils Relative %: 87 % — ABNORMAL HIGH (ref 43–77)

## 2011-02-05 LAB — TROPONIN I: Troponin I: 0.01 ng/mL (ref 0.00–0.06)

## 2011-02-05 LAB — RAPID URINE DRUG SCREEN, HOSP PERFORMED
Cocaine: NOT DETECTED
Tetrahydrocannabinol: NOT DETECTED

## 2011-02-05 LAB — CK TOTAL AND CKMB (NOT AT ARMC)
CK, MB: 4.9 ng/mL — ABNORMAL HIGH (ref 0.3–4.0)
CK, MB: 5.4 ng/mL — ABNORMAL HIGH (ref 0.3–4.0)
CK, MB: 7.1 ng/mL (ref 0.3–4.0)
Relative Index: 3.1 — ABNORMAL HIGH (ref 0.0–2.5)
Relative Index: 3.1 — ABNORMAL HIGH (ref 0.0–2.5)
Total CK: 157 U/L (ref 7–232)
Total CK: 158 U/L (ref 7–232)

## 2011-02-06 NOTE — Assessment & Plan Note (Signed)
Summary: allergy testing/has protocol sheet//kcw   Vital Signs:  Patient profile:   45 year old male Height:      71 inches Weight:      182.13 pounds BMI:     25.49 O2 Sat:      97 % on Room air Pulse rate:   82 / minute BP sitting:   134 / 80  (left arm) Cuff size:   regular  Vitals Entered By: Boone Master CNA/MA (January 31, 2011 3:40 PM)  O2 Flow:  Room air CC: allergy testing Comments Medications reviewed with patient Daytime contact number verified with patient. Boone Master CNA/MA  January 31, 2011 3:41 PM    Primary Provider/Referring Provider:  Dr. Almetta Lovely  CC:  allergy testing.  History of Present Illness: History of Present Illness: January 15, 2011- 44 yoM followed for asthma by Dr [name] and referred for allergy evaluation, considering Xolair, but needing documentation to qualify Chronic asthma as an adult, dx'd 1995, smoked only briefly. On prednisone repeatedly since 1995, now at 10 mg daily, finishing taper. IgE was 183 12/22/10 on in vitro testing, with no specific elevations. . Says allergy skin testing in [facility] not strongly positive.  Triggers- virus infections dominate. Hx Asthma/ nasal polyps/ Asthma Triad. Hx sinus surgery x 3 for nasal polyps. Treated for sinusitis with augmentin 12/22/10. Denies hx skin rash/ urticaria We reviewed his medication list. He took Zyrtec last night. Continues Flonase, Symbicort, Ventolin HFA, Singulair and has nebulizer.   January 31, 2011- Chronic asthma (Dr [name]), considering Xolair Nurse-CC: allergy testing Feels well, admits a little rattle- good day for him. Denies sneeze, watery nose blowing etc. IgE was 180. Skin test- Positives for grass, weed and tree.   Preventive Screening-Counseling & Management  Alcohol-Tobacco     Alcohol drinks/day: socially     Smoking Status: quit     Packs/Day: 0.25     Year Started: 1998     Year Quit: 2000  Current Medications (verified): 1)  Symbicort 160-4.5 Mcg/act  Aero (Budesonide-Formoterol Fumarate) .... Inhale 2 Puffs Two Times A Day 2)  Zyrtec Allergy 10 Mg Tabs (Cetirizine Hcl) .Marland Kitchen.. 1 By Mouth At Bedtime 3)  Zantac 150 Mg Caps (Ranitidine Hcl) .Marland Kitchen.. 1 At Bedtime 4)  Benicar Hct 20-12.5 Mg Tabs (Olmesartan Medoxomil-Hctz) .... Take 1 Tablet By Mouth Once A Day 5)  Flonase 50 Mcg/act Susp (Fluticasone Propionate) .... 2 Sprays Each Nostril Once Daily 6)  Albuterol Sulfate (2.5 Mg/7ml) 0.083% Nebu (Albuterol Sulfate) .Marland Kitchen.. 1 Vial Up To Twice A Day As Needed For  Wheezing or Shortness of Breath 7)  Ventolin Hfa 108 (90 Base) Mcg/act Aers (Albuterol Sulfate) .... 2 Puffs Every 4 Hr As Needed Wheezing 8)  Nasalcrom 5.2 Mg/act Aers (Cromolyn Sodium) .Marland Kitchen.. 1 Spray Each Nostril Four Times A Day 9)  Prednisone 10 Mg Tabs (Prednisone) .... Once Daily  Allergies (verified): 1)  ! Nsaids 2)  ! 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  <1ppd. 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 Nasal polyps/ aspirin allergy    Review of Systems      See HPI       The patient complains of shortness of breath with  activity, non-productive cough, and nasal congestion/difficulty breathing through nose.  The patient denies shortness of breath at rest, coughing up blood, chest pain, irregular heartbeats, acid heartburn, indigestion, loss of appetite, weight change, abdominal pain, difficulty swallowing, sore throat, tooth/dental problems, headaches, sneezing, joint stiffness or pain, rash, change in color of mucus, and fever.    Physical Exam  Additional Exam:  General: A/Ox3; pleasant and cooperative, NAD, SKIN: no rash, lesions NODES: no lymphadenopathy HEENT: [place]/AT, EOM- WNL, Conjuctivae- clear, PERRLA, TM-WNL, Nose- question minor polyp under left superior turbinate Throat- clear and wnl, Mallampati  II, hoarse, no stridor NECK: Supple w/ fair ROM, JVD- none, normal carotid impulses w/o bruits Thyroid- normal to palpation CHEST: wheeze, especially right lateral chest, unlabored HEART: RRR, no m/g/r heard ABDOMEN:not overweight JYN:WGNF, nl pulses, no edema  NEURO: Grossly intact to observation      Impression & Recommendations:  Problem # 1:  ASTHMA, PERSISTENT, SEVERE (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 I reviewed mechanism and measures of allergy vaccine again. He has ordered Xolair and will be talking to the [facility]. I will see him again as needed . Positive IgE response on skin test for Aspergillus raises potential for ABPA as a future consideration as he talks with Dr [name].   Problem # 2:  RHINITIS (ICD-472.0)  Discussion relative to asthma is pertinent to Nasal syptoms as well.   Other Orders: Est. Patient Level III (62130) Allergy Puncture Test (86578) Allergy I.D Test (46962)  Patient Instructions: 1)  Please schedule a follow-up appointment in 2 months with Dr [name].  2)      I can see you again if needed.  3)  Ok to proceed wih Xolair therapy   Orders Added: 1)  Est. Patient Level III [95284] 2)  Allergy Puncture Test [95004] 3)  Allergy I.D Test  [13244]

## 2011-02-07 LAB — STOOL CULTURE

## 2011-02-07 NOTE — Discharge Summary (Signed)
NAMERODD, HEFT                 ACCOUNT NO.:  [id number]  MEDICAL RECORD NO.:  [id number]           PATIENT TYPE:  I  LOCATION:  1519                         FACILITY:  [facility]  PHYSICIAN:  Kathlen Mody, MD       DATE OF BIRTH:  30-Apr-1966  DATE OF ADMISSION:  02/02/2011 DATE OF DISCHARGE:  02/04/2011                              DISCHARGE SUMMARY   DISCHARGE DIAGNOSES: 1. Acute gastroenteritis. 2. Asthma exacerbation. 3. Sinusitis. 4. Hypertension. 5. Gastroesophageal reflux disease.  DISCHARGE MEDICATIONS: 1. Protonix 40 mg daily p.o. 2. Symbicort 160/4.5 mcg inhalation 2 puffs twice a day. 3. Prednisone 60 mg for 1 day, followed by 50 mg for 3 days, followed     by 40 mg for 3 days, followed by 30 mg for 3 days, followed by 20     mg for 3 days, followed by 10 mg daily. 4. Benicar HCT 20/12.5 mg p.o. 1 tablet twice a day. 5. Flonase 2 sprays daily. 6. Cromolyn nasal spray 4 times a day. 7. One tablet of multivitamin daily. 8. Cetirizine 10 mg p.o. one tab daily. 9. Albuterol inhalation 2 puffs q.6 p.r.n. 10.Albuterol nebulizers p.r.n.  PERTINENT LABS:  Had a CBC, which showed WBC of 15.6.  Rest of the CBC was within normal limits.  Basic metabolic panel within normal limits. CK-MB and troponin within normal limits.  Urine drug screen negative. MRSA screen negative.  BMP on the day of discharge within normal limits.  RADIOLOGY:  Chest x-ray, negative.  No acute cardiopulmonary abnormalities.  CONSULTS CALLED:  Pulmonary consult called.  PROCEDURES DONE:  None.  BRIEF HOSPITAL COURSE:  This is a 45 year old gentleman with past medical history of asthma admitted for nausea, vomiting, and abdominal pain and shortness of breath most likely secondary to acute gastroenteritis.  The patient was started on IV fluids and put on IV Zofran.  The symptoms have resolved at this time and stool cultures did not show anything abnormal this time.  Asthma exacerbation.   Pulmonary consult was called.  He was put on IV steroids, which were later transitioned to p.o. steroids and is being discharged on a steroid taper and is asked to follow up with the Dr. [name], pulmonologist, as outpatient in the next 1 week.  Also continue with Symbicort, albuterol MDI inhaler, and albuterol nebulization as needed.  GERD, was given Protonix and appeared stable. On the day of discharge, the patient's vitals, has a temperature of 98.3, pulse of 82, respirations 17, blood pressure 158/95, saturating 96% on room air.  On exam, he is alert, afebrile, oriented x3, comfortable.  Respiratory exam, good air entry bilateral.  No wheeze or rhonchi.  Abdomen is soft, nontender, nondistended.  Cardiovascular; S1and S2 heard.  No rubs, murmurs, or gallops.  Extremities; no pedal edema.  The patient is hemodynamically stable for discharge.  His blood pressure is slightly on the high side most likely secondary to the steroids.  The patient was asked to double his blood pressure medications, that is the Benicar HCT, and asked to follow with his PCP in about 1 to 2 weeks after  his steroid taper and check the blood pressure.          ______________________________ Kathlen Mody, MD     VA/MEDQ  D:  02/04/2011  T:  02/05/2011  Job:  [id number]  Electronically Signed by Kathlen Mody MD on 02/07/2011 09:23:25 PM

## 2011-02-08 ENCOUNTER — Encounter: Payer: Self-pay | Admitting: Pulmonary Disease

## 2011-02-08 ENCOUNTER — Ambulatory Visit (INDEPENDENT_AMBULATORY_CARE_PROVIDER_SITE_OTHER): Payer: BC Managed Care – PPO | Admitting: Pulmonary Disease

## 2011-02-08 DIAGNOSIS — J45909 Unspecified asthma, uncomplicated: Secondary | ICD-10-CM

## 2011-02-13 NOTE — Miscellaneous (Signed)
Summary: [facility]  [facility]   Imported By: Lester Neodesha 02/08/2011 09:03:05  _____________________________________________________________________  External Attachment:    Type:   Image     Comment:   External Document

## 2011-02-13 NOTE — Letter (Signed)
Summary: [facility] for Xolair / [employer]  [facility] for Xol[facility] [employer]   Imported By: Lennie Odor 02/06/2011 16:52:26  _____________________________________________________________________  External Attachment:    Type:   Image     Comment:   External Document

## 2011-02-13 NOTE — Miscellaneous (Signed)
Summary: [facility]  [facility]   Imported By: Lester Sinclair 02/08/2011 09:04:55  _____________________________________________________________________  External Attachment:    Type:   Image     Comment:   External Document

## 2011-02-13 NOTE — Assessment & Plan Note (Signed)
Summary: Xolair new start//jwr   Visit Type:  Follow-up Primary Provider/Referring Provider:  Dr. Almetta Lovely  CC:  Pt here for new start Xolair. Pt wants to discuss other options given at hospital  .  History of Present Illness: 45 yo African American malefor FU of severe persistent asthma  difficult to control in past  requiring multiple intubations. Asthma Onset age 76 when he worked in a [employer], ETT x 6, once this yr in 2/10, NSAID ( Advil)  incuded, Allergy skin test neg, s/p nasal surgery for polyps & deviated septum, baseline prednisone 10 alternating with 5 mg, BPF > 600 CT sinuses 3/10 - widespread sinus inflammatory disease ? polyp   December 22, 2010 -Lost to FU x 1 yr Was admitted 12/18-12/22 for asthma exacerbation and sinusitis. Tx w/ IV abx, nebs and steroids. He was discharged home on Augmentin and steroid taper. Ffinished round of augmentin from discharge. CT sinus showed bilateral sinusitis. He is feeling better. Was changed from advair to symbicort however he misunderstood and has been taking both . Spiriva was added as well. Rare use of ventolin since discharge.  c/o purulent nasal discharge  January 01, 2011 4:10 PM  c/o sinus pressure  - improved with Abx  RAST reviewed IgE 183, discussed xolair - goals of therapy & side effects Skin test- Positives for grass, weed and tree. Positive IgE response on skin test for Aspergillus raises potential for ABPA as a future consideration   February 08, 2011 1:46 PM  Admitted for food poisoning followed by wheeezing - resolved in 1-2 days To start with xolair today, he is familiar with using epi-pen. Discussed thermoplasty - he had questions.   Preventive Screening-Counseling & Management  Alcohol-Tobacco     Alcohol drinks/day: socially     Smoking Status: quit     Packs/Day: 0.25     Year Started: 1998     Year Quit: 2000  Current Medications (verified): 1)  Symbicort 160-4.5 Mcg/act Aero (Budesonide-Formoterol  Fumarate) .... Inhale 2 Puffs Two Times A Day 2)  Zyrtec Allergy 10 Mg Tabs (Cetirizine Hcl) .Marland Kitchen.. 1 By Mouth At Bedtime 3)  Zantac 150 Mg Caps (Ranitidine Hcl) .Marland Kitchen.. 1 At Bedtime 4)  Benicar Hct 20-12.5 Mg Tabs (Olmesartan Medoxomil-Hctz) .... Take 1 Tablet By Mouth Two Times A Day 5)  Flonase 50 Mcg/act Susp (Fluticasone Propionate) .... 2 Sprays Each Nostril Once Daily 6)  Albuterol Sulfate (2.5 Mg/45ml) 0.083% Nebu (Albuterol Sulfate) .Marland Kitchen.. 1 Vial Up To Twice A Day As Needed For  Wheezing or Shortness of Breath 7)  Ventolin Hfa 108 (90 Base) Mcg/act Aers (Albuterol Sulfate) .... 2 Puffs Every 4 Hr As Needed Wheezing 8)  Nasalcrom 5.2 Mg/act Aers (Cromolyn Sodium) .Marland Kitchen.. 1 Spray Each Nostril Four Times A Day 9)  Prednisone 10 Mg Tabs (Prednisone) .... As Directed Taper 10)  Multivitamins   Tabs (Multiple Vitamin) .... Take 1 Tablet By Mouth Once A Day 11)  Cranberry .... Take 1 Tablet By Mouth Once A Day  Allergies (verified): 1)  ! Nsaids 2)  ! Jonne Ply  Past History:  Past Medical History: Last updated: 01/31/2011 SINUSITIS, CHRONIC (ICD-473.9) HYPERTENSION (ICD-401.9) Hx of NONSPECIFIC LOW BLOOD PRESSURE READING (ICD-796.3) Hx of RESPIRATORY FAILURE, ACUTE (ICD-518.81) ASTHMA, PERSISTENT, SEVERE (ICD-493.90)                Allergy skin test- 01/31/11- Positives grass, weed, tree pollens, Aspergillus. 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  <1ppd. social alcohol   Review of Systems  The patient denies anorexia, fever, weight loss, weight gain, vision loss, decreased hearing, hoarseness, chest pain, syncope, dyspnea on exertion, peripheral edema, prolonged cough, headaches, hemoptysis, abdominal pain, melena, hematochezia, severe indigestion/heartburn, muscle weakness, suspicious skin lesions, transient blindness, difficulty walking, depression,  unusual weight change, abnormal bleeding, enlarged lymph nodes, and angioedema.    Vital Signs:  Patient profile:   45 year old male Height:      71 inches Weight:      181 pounds BMI:     25.34 O2 Sat:      96 % on Room air Temp:     97.9 degrees F oral Pulse rate:   89 / minute BP sitting:   126 / 88  (left arm) Cuff size:   regular  Vitals Entered By: Zackery Barefoot CMA (February 08, 2011 1:34 PM)  O2 Flow:  Room air CC: Pt here for new start Xolair. Pt wants to discuss other options given at hospital   Comments Medications reviewed with patient Verified contact number and pharmacy with patient Zackery Barefoot CMA  February 08, 2011 1:34 PM    Physical Exam  Additional Exam:  General: A/Ox3; pleasant and cooperative, NAD, NODES: no lymphadenopathy HEENT: [place]/AT, EOM- WNL, Conjuctivae- clear, PERRLA, TM-WNL, Nose- question minor polyp under left superior turbinate Throat- clear and wnl, Mallampati  II, hoarse, no stridor NECK: Supple w/ fair ROM, JVD- none, normal carotid impulses w/o bruits Thyroid- normal to palpation CHEST: wheeze, especially right lateral chest, unlabored HEART: RRR, no m/g/r heard UEA:VWUJ, nl pulses, no edema        Impression & Recommendations:  Problem # 1:  EXTRINSIC ASTHMA, UNSPECIFIED (ICD-493.00) Start xolair Stay on pred 10 mg, symbicort & singulair Step down only if he remians stable x 3-6 mnths  Medications Added to Medication List This Visit: 1)  Benicar Hct 20-12.5 Mg Tabs (Olmesartan medoxomil-hctz) .... Take 1 tablet by mouth two times a day 2)  Prednisone 10 Mg Tabs (Prednisone) .... As directed taper 3)  Multivitamins Tabs (Multiple vitamin) .... Take 1 tablet by mouth once a day 4)  Cranberry  .... Take 1 tablet by mouth once a day  Other Orders: Administration xolair injection [phone]) Est. Patient Level II (47829)  Patient Instructions: 1)  Copy sent to: Dr Renae Gloss 2)  Please schedule a follow-up appointment in 2  months. 3)  Ok to take [employer] shot    Medication Administration  Injection # 1:    Medication: Xolair (omalizumab) 150mg     Diagnosis: EXTRINSIC ASTHMA, UNSPECIFIED (ICD-493.00)    Route: SQ    Site: L deltoid    Exp Date: 02/2014    Lot #: [id number]    Mfr: Salome Spotted    Comments: 1.2 ml in left and right arm 300mg  charged [id number]    Given by: tammy scott in allergy lab  Orders Added: 1)  Administration xolair injection [96401] 2)  Est. Patient Level II [46962]

## 2011-02-13 NOTE — Miscellaneous (Signed)
Summary: Acknowledgement for Xolair / Teacher, music for Xolair / [employer]   Imported By: Lennie Odor 02/06/2011 17:06:10  _____________________________________________________________________  External Attachment:    Type:   Image     Comment:   External Document

## 2011-02-14 LAB — CBC
HCT: 48.4 % (ref 39.0–52.0)
Hemoglobin: 16.2 g/dL (ref 13.0–17.0)
MCHC: 33.3 g/dL (ref 30.0–36.0)
MCHC: 33.5 g/dL (ref 30.0–36.0)
MCV: 87.5 fL (ref 78.0–100.0)
MCV: 87.6 fL (ref 78.0–100.0)
Platelets: 220 10*3/uL (ref 150–400)
Platelets: 221 10*3/uL (ref 150–400)
Platelets: 243 10*3/uL (ref 150–400)
RBC: 5.53 MIL/uL (ref 4.22–5.81)
RDW: 12.9 % (ref 11.5–15.5)
RDW: 13.3 % (ref 11.5–15.5)
WBC: 12.3 10*3/uL — ABNORMAL HIGH (ref 4.0–10.5)
WBC: 16.5 K/uL — ABNORMAL HIGH (ref 4.0–10.5)

## 2011-02-14 LAB — GLUCOSE, CAPILLARY
Glucose-Capillary: 107 mg/dL — ABNORMAL HIGH (ref 70–99)
Glucose-Capillary: 117 mg/dL — ABNORMAL HIGH (ref 70–99)
Glucose-Capillary: 117 mg/dL — ABNORMAL HIGH (ref 70–99)
Glucose-Capillary: 118 mg/dL — ABNORMAL HIGH (ref 70–99)
Glucose-Capillary: 128 mg/dL — ABNORMAL HIGH (ref 70–99)
Glucose-Capillary: 130 mg/dL — ABNORMAL HIGH (ref 70–99)
Glucose-Capillary: 138 mg/dL — ABNORMAL HIGH (ref 70–99)
Glucose-Capillary: 139 mg/dL — ABNORMAL HIGH (ref 70–99)
Glucose-Capillary: 139 mg/dL — ABNORMAL HIGH (ref 70–99)
Glucose-Capillary: 142 mg/dL — ABNORMAL HIGH (ref 70–99)
Glucose-Capillary: 149 mg/dL — ABNORMAL HIGH (ref 70–99)
Glucose-Capillary: 150 mg/dL — ABNORMAL HIGH (ref 70–99)
Glucose-Capillary: 157 mg/dL — ABNORMAL HIGH (ref 70–99)
Glucose-Capillary: 158 mg/dL — ABNORMAL HIGH (ref 70–99)
Glucose-Capillary: 199 mg/dL — ABNORMAL HIGH (ref 70–99)
Glucose-Capillary: 86 mg/dL (ref 70–99)

## 2011-02-14 LAB — BLOOD GAS, ARTERIAL
Acid-Base Excess: 0.6 mmol/L (ref 0.0–2.0)
Acid-base deficit: 0.4 mmol/L (ref 0.0–2.0)
Acid-base deficit: 2.4 mmol/L — ABNORMAL HIGH (ref 0.0–2.0)
Acid-base deficit: 6 mmol/L — ABNORMAL HIGH (ref 0.0–2.0)
Bicarbonate: 22.9 mEq/L (ref 20.0–24.0)
Bicarbonate: 23.2 mEq/L (ref 20.0–24.0)
Bicarbonate: 23.9 mEq/L (ref 20.0–24.0)
Bicarbonate: 28.2 meq/L — ABNORMAL HIGH (ref 20.0–24.0)
Drawn by: 145321
Drawn by: 145321
FIO2: 0.3 %
FIO2: 0.5 %
FIO2: 1 %
FIO2: 1 %
MECHVT: 650 mL
MECHVT: 650 mL
O2 Saturation: 97.8 %
O2 Saturation: 98.4 %
O2 Saturation: 99.3 %
O2 Saturation: 99.5 %
PEEP: 5 cmH2O
PEEP: 5 cmH2O
Patient temperature: 98.5
Patient temperature: 98.6
Patient temperature: 98.6
Patient temperature: 98.6
RATE: 10 {breaths}/min
RATE: 14 resp/min
TCO2: 20.9 mmol/L (ref 0–100)
TCO2: 21.3 mmol/L (ref 0–100)
TCO2: 24.7 mmol/L (ref 0–100)
pCO2 arterial: 58.9 mmHg (ref 35.0–45.0)
pCO2 arterial: 62.7 mmHg (ref 35.0–45.0)
pH, Arterial: 7.194 — CL (ref 7.350–7.450)
pH, Arterial: 7.301 — ABNORMAL LOW (ref 7.350–7.450)
pH, Arterial: 7.394 (ref 7.350–7.450)
pO2, Arterial: 118 mmHg — ABNORMAL HIGH (ref 80.0–100.0)
pO2, Arterial: 243 mmHg — ABNORMAL HIGH (ref 80.0–100.0)
pO2, Arterial: 604 mmHg — ABNORMAL HIGH (ref 80.0–100.0)

## 2011-02-14 LAB — HEPARIN LEVEL (UNFRACTIONATED): Heparin Unfractionated: <0.1 IU/mL — ABNORMAL LOW (ref 0.30–0.70)

## 2011-02-14 LAB — BASIC METABOLIC PANEL
BUN: 16 mg/dL (ref 6–23)
BUN: 18 mg/dL (ref 6–23)
CO2: 26 mEq/L (ref 19–32)
Calcium: 8.7 mg/dL (ref 8.4–10.5)
Calcium: 9 mg/dL (ref 8.4–10.5)
Chloride: 110 mEq/L (ref 96–112)
Creatinine, Ser: 1.33 mg/dL (ref 0.4–1.5)
Creatinine, Ser: 1.38 mg/dL (ref 0.4–1.5)
Creatinine, Ser: 1.74 mg/dL — ABNORMAL HIGH (ref 0.4–1.5)
GFR calc Af Amer: >60 mL/min (ref >60)
GFR calc non Af Amer: 43 mL/min — ABNORMAL LOW (ref >60)
GFR calc non Af Amer: 57 mL/min — ABNORMAL LOW (ref >60)
Glucose, Bld: 115 mg/dL — ABNORMAL HIGH (ref 70–99)
Glucose, Bld: 133 mg/dL — ABNORMAL HIGH (ref 70–99)
Sodium: 140 mEq/L (ref 135–145)

## 2011-02-14 LAB — DIFFERENTIAL
Basophils Absolute: 0 10*3/uL (ref 0.0–0.1)
Basophils Relative: 0 % (ref 0–1)
Eosinophils Absolute: 0.5 10*3/uL (ref 0.0–0.7)
Eosinophils Relative: 3 % (ref 0–5)
Lymphocytes Relative: 46 % (ref 12–46)
Lymphs Abs: 7.5 K/uL — ABNORMAL HIGH (ref 0.7–4.0)
Monocytes Absolute: 1.2 10*3/uL — ABNORMAL HIGH (ref 0.1–1.0)
Monocytes Relative: 7 % (ref 3–12)
Neutro Abs: 7.3 K/uL (ref 1.7–7.7)
Neutrophils Relative %: 44 % (ref 43–77)

## 2011-02-14 LAB — POCT I-STAT, CHEM 8
BUN: 14 mg/dL (ref 6–23)
Calcium, Ion: 1.16 mmol/L (ref 1.12–1.32)
Chloride: 106 meq/L (ref 96–112)
Creatinine, Ser: 1 mg/dL (ref 0.4–1.5)
Glucose, Bld: 137 mg/dL — ABNORMAL HIGH (ref 70–99)
HCT: 51 % (ref 39.0–52.0)
Hemoglobin: 17.3 g/dL — ABNORMAL HIGH (ref 13.0–17.0)
Potassium: 3.4 mEq/L — ABNORMAL LOW (ref 3.5–5.1)
Sodium: 142 meq/L (ref 135–145)
TCO2: 33 mmol/L (ref 0–100)

## 2011-02-14 LAB — PATHOLOGIST SMEAR REVIEW

## 2011-02-17 NOTE — H&P (Signed)
[name], CLOCK                 ACCOUNT NO.:  [id number]  MEDICAL RECORD NO.:  [id number]           PATIENT TYPE:  E  LOCATION:  WLED                         FACILITY:  [facility]  PHYSICIAN:  Calvert Cantor, M.D.     DATE OF BIRTH:  19-Jul-1966  DATE OF ADMISSION:  02/02/2011 DATE OF DISCHARGE:                             HISTORY & PHYSICAL   PRIMARY CARE PHYSICIAN:  Merlene Laughter. Renae Gloss, M.D.  PULMONOLOGIST:  Oretha Milch, M.D.  CHIEF COMPLAINT:  Vomiting, diarrhea and asthma exacerbation.  HISTORY OF PRESENT ILLNESS:  This is a 45 year old male with a history of severe asthma exacerbation (he has been intubated over seven times in the past).  He is currently on BiPAP and thus gives a limited history. He reports feeling fine yesterday.  Awoke this a.m. and began vomiting and diarrhea.  He reports four to five episodes of both vomiting and diarrhea today.  He also describes a left-sided pain in his chest, which he feels is from retching.  In the midst of his vomiting and diarrhea, he began to have an asthma attack and he presents via EMS to the Emergency Department in hypoxic respiratory failure.  PAST MEDICAL HISTORY:  Significant for: 1. Asthma. 2. Sinusitis. 3. Hypertension.  PAST SURGICAL HISTORY:  Surgeries include: 1. Nasal polyp removal. 2. Left knee surgery.  HOME MEDICATIONS: 1. Albuterol inhaler two puffs every 6 hours as needed. 2. Tylenol Extra Strength acetaminophen 500 mg two tablets q.6 hours     as needed for pain. 3. NasalCrom 2.5 mg nasal spray four times daily. 4. Albuterol nebulization one nebulizer q.6 hours as needed for     wheezing. 5. Zyrtec 10 mg one tablet daily. 6. Zantac 150 mg one tablet every morning. 7. Flonase nasal spray two sprays daily. 8. Prednisone 10 mg one tablet daily. 9. Multivitamins one tablet daily. 10.Benicar HCT 20/12.5 one tablet daily. 11.Symbicort 160/4.5 two puffs twice daily.  ALLERGIES:  Include ASPIRIN, which  causes anaphylaxis and NSAIDS.  REVIEW OF SYSTEMS:  As per HPI.  Otherwise negative.  Difficult to obtain secondary to the patient being on BiPAP.  FAMILY HISTORY:  Per E-Chart is significant for both parents having had a stroke.  SOCIAL HISTORY:  Positive for rare alcohol use.  He is an ex-smoker who quit approximately 10 years ago.  He denies any recreational drug use.  PHYSICAL EXAMINATION:  GENERAL:  This is a well-developed, well- nourished-appearing, African-American male who is lying in [facility] ED, currently on BiPAP. VITAL SIGNS:  Temperature 98.7, pulse 24, respirations 16, blood pressure 146/65. HEENT:  Head is atraumatic, normocephalic.  Eyes are anicteric with pupils that are equal, round and reactive to light.  Nose shows no nasal discharge or exterior lesions.  Mouth:  The patient is currently on BiPAP so it was not examined, but he does have good dentition. NECK:  Supple with midline trachea.  No JVD.  No lymphadenopathy. RESPIRATORY:  Chest demonstrates accessory muscle use as the patient is on BiPAP.  He does have some expiratory wheeze in the left lower lung lobe.  He also has  rhonchi. CARDIOVASCULAR:  He is tachycardic without obvious murmur, rub or gallop. ABDOMEN:  Soft, nontender, nondistended.  No appreciable masses. EXTREMITIES:  Show no clubbing, cyanosis or edema.  He has 5/5 strength in each extremity. SKIN:  There are no rashes, bruises or lesions. NEURO:  Cranial nerves II-XII appear grossly intact.  He has no facial asymmetries.  No obvious focal neuro deficits. PSYCHIATRIC:  The patient is alert and oriented.  His demeanor is cooperative and appropriate.  His grooming is excellent.  LABORATORY DATA:  Pertinent for white count of 15.6, hemoglobin 16, hematocrit 48, platelets 232, BUN 17, creatinine 1.21, sodium 143, potassium 3.5.  RADIOLOGIC:  Chest x-ray is negative.  ASSESSMENT:  Dr. Calvert Cantor has seen and examined the  patient, collected a history, reviewed his chart and spoken at length with the patient as well as the PA about the case.  Her impression is that: 1. This is a 45 year old African-American male with a history of     severe asthma who presents with acute-on-chronic hypoxic     respiratory failure secondary to asthma exacerbation. 2. He has nausea, vomiting and diarrhea, likely viral in nature. 3. Tachycardia. 4. Hypertension.  PLAN: 1. We will admit him to the step-down unit, wean him from BiPAP and     place him on continuous pulse oximetry.  For his respiratory     failure we will provide him with Solu-Medrol IV, nebulizers and     supportive care.  This patient tends to decline quickly and he is a     patient of Dr. Reginia Naas.  Consequently, we will go ahead and get a     Pulmonary consult to have Dr. Reginia Naas team on board. 2. For his vomiting and diarrhea, we will place him on contact     precautions, check stool cultures and give him     supportive measures with fluids and Zofran and clear liquids.  We     will not start any antibiotics yet.  Further recommendations will     be forthcoming pending the patient's medical evolution. 3. This patient is a Full Code.     Stephani Police, PA   ______________________________ Calvert Cantor, M.D.    MLY/MEDQ  D:  02/02/2011  T:  02/02/2011  Job:  [id number]  cc:   Merlene Laughter. Renae Gloss, M.D. Fax: [phone]  Oretha Milch, MD [address] [place] [address]  Electronically Signed by Algis Downs PA on 02/06/2011 09:26:00 AM Electronically Signed by Calvert Cantor M.D. on 02/17/2011 04:58:11 PM

## 2011-03-08 LAB — DIFFERENTIAL
Basophils Absolute: 0 10*3/uL (ref 0.0–0.1)
Basophils Relative: 0 % (ref 0–1)
Eosinophils Absolute: 0 10*3/uL (ref 0.0–0.7)
Monocytes Absolute: 0.2 10*3/uL (ref 0.1–1.0)
Monocytes Relative: 4 % (ref 3–12)
Neutro Abs: 5.3 10*3/uL (ref 1.7–7.7)
Neutrophils Relative %: 83 % — ABNORMAL HIGH (ref 43–77)

## 2011-03-08 LAB — GLUCOSE, CAPILLARY
Glucose-Capillary: 141 mg/dL — ABNORMAL HIGH (ref 70–99)
Glucose-Capillary: 141 mg/dL — ABNORMAL HIGH (ref 70–99)
Glucose-Capillary: 142 mg/dL — ABNORMAL HIGH (ref 70–99)
Glucose-Capillary: 149 mg/dL — ABNORMAL HIGH (ref 70–99)
Glucose-Capillary: 98 mg/dL (ref 70–99)

## 2011-03-08 LAB — CULTURE, BLOOD (ROUTINE X 2): Culture: NO GROWTH

## 2011-03-08 LAB — URINE DRUGS OF ABUSE SCREEN W ALC, ROUTINE (REF LAB)
Barbiturate Quant, Ur: NEGATIVE
Creatinine,U: 106.5 mg/dL
Methadone: NEGATIVE
Phencyclidine (PCP): NEGATIVE
Propoxyphene: NEGATIVE

## 2011-03-08 LAB — POCT I-STAT 3, ART BLOOD GAS (G3+)
Acid-base deficit: 9 mmol/L — ABNORMAL HIGH (ref 0.0–2.0)
O2 Saturation: 94 %
Patient temperature: 37
TCO2: 25 mmol/L (ref 0–100)
pH, Arterial: 7.444 (ref 7.350–7.450)
pO2, Arterial: 103 mmHg — ABNORMAL HIGH (ref 80.0–100.0)

## 2011-03-08 LAB — PROTIME-INR: Prothrombin Time: 14 seconds (ref 11.6–15.2)

## 2011-03-08 LAB — URINALYSIS, ROUTINE W REFLEX MICROSCOPIC
Bilirubin Urine: NEGATIVE
Ketones, ur: NEGATIVE mg/dL
Nitrite: NEGATIVE
Specific Gravity, Urine: 1.017 (ref 1.005–1.030)
Urobilinogen, UA: 0.2 mg/dL (ref 0.0–1.0)

## 2011-03-08 LAB — COMPREHENSIVE METABOLIC PANEL
ALT: 16 U/L (ref 0–53)
Alkaline Phosphatase: 45 U/L (ref 39–117)
BUN: 10 mg/dL (ref 6–23)
Chloride: 102 mEq/L (ref 96–112)
Glucose, Bld: 161 mg/dL — ABNORMAL HIGH (ref 70–99)
Potassium: 3.3 mEq/L — ABNORMAL LOW (ref 3.5–5.1)
Sodium: 135 mEq/L (ref 135–145)
Total Bilirubin: 0.3 mg/dL (ref 0.3–1.2)

## 2011-03-08 LAB — URINE CULTURE
Culture: NO GROWTH
Special Requests: NEGATIVE

## 2011-03-08 LAB — CBC
HCT: 39.8 % (ref 39.0–52.0)
Hemoglobin: 13.4 g/dL (ref 13.0–17.0)
MCHC: 33.8 g/dL (ref 30.0–36.0)
MCHC: 34.2 g/dL (ref 30.0–36.0)
MCV: 86.5 fL (ref 78.0–100.0)
Platelets: 243 10*3/uL (ref 150–400)
RBC: 4.6 MIL/uL (ref 4.22–5.81)
RDW: 13.1 % (ref 11.5–15.5)
RDW: 13.3 % (ref 11.5–15.5)

## 2011-03-08 LAB — BASIC METABOLIC PANEL
CO2: 26 mEq/L (ref 19–32)
Calcium: 8.3 mg/dL — ABNORMAL LOW (ref 8.4–10.5)
Creatinine, Ser: 1.01 mg/dL (ref 0.4–1.5)
Glucose, Bld: 144 mg/dL — ABNORMAL HIGH (ref 70–99)

## 2011-03-08 LAB — CULTURE, RESPIRATORY W GRAM STAIN

## 2011-03-08 LAB — PHOSPHORUS: Phosphorus: 3.8 mg/dL (ref 2.3–4.6)

## 2011-03-08 LAB — TSH: TSH: 0.128 u[IU]/mL — ABNORMAL LOW (ref 0.350–4.500)

## 2011-03-08 LAB — EXPECTORATED SPUTUM ASSESSMENT W GRAM STAIN, RFLX TO RESP C

## 2011-03-08 LAB — CARDIAC PANEL(CRET KIN+CKTOT+MB+TROPI)
Total CK: 112 U/L (ref 7–232)
Total CK: 114 U/L (ref 7–232)
Troponin I: <0.01 ng/mL (ref 0.00–0.06)

## 2011-03-08 LAB — BRAIN NATRIURETIC PEPTIDE: Pro B Natriuretic peptide (BNP): 41 pg/mL (ref 0.0–100.0)

## 2011-03-08 LAB — TROPONIN I: Troponin I: <0.01 ng/mL (ref 0.00–0.06)

## 2011-03-12 ENCOUNTER — Ambulatory Visit (INDEPENDENT_AMBULATORY_CARE_PROVIDER_SITE_OTHER): Payer: BC Managed Care – PPO

## 2011-03-12 DIAGNOSIS — J45909 Unspecified asthma, uncomplicated: Secondary | ICD-10-CM

## 2011-03-12 MED ORDER — OMALIZUMAB 150 MG ~~LOC~~ SOLR
300.0000 mg | Freq: Once | SUBCUTANEOUS | Status: AC
  Administered 2011-03-12: 300 mg via SUBCUTANEOUS

## 2011-03-13 LAB — DIFFERENTIAL
Basophils Relative: 0 % (ref 0–1)
Eosinophils Relative: 3 % (ref 0–5)
Monocytes Absolute: 0.6 10*3/uL (ref 0.1–1.0)
Monocytes Relative: 5 % (ref 3–12)
Neutro Abs: 4.2 10*3/uL (ref 1.7–7.7)

## 2011-03-13 LAB — POCT I-STAT 3, ART BLOOD GAS (G3+)
Acid-base deficit: 2 mmol/L (ref 0.0–2.0)
Bicarbonate: 25.3 mEq/L — ABNORMAL HIGH (ref 20.0–24.0)
O2 Saturation: 100 %
O2 Saturation: 100 %
Patient temperature: 98
TCO2: 24 mmol/L (ref 0–100)
pCO2 arterial: 43.4 mmHg (ref 35.0–45.0)
pO2, Arterial: 214 mmHg — ABNORMAL HIGH (ref 80.0–100.0)
pO2, Arterial: 89 mmHg (ref 80.0–100.0)

## 2011-03-13 LAB — COMPREHENSIVE METABOLIC PANEL
AST: 31 U/L (ref 0–37)
Albumin: 3.3 g/dL — ABNORMAL LOW (ref 3.5–5.2)
Alkaline Phosphatase: 36 U/L — ABNORMAL LOW (ref 39–117)
BUN: 11 mg/dL (ref 6–23)
GFR calc Af Amer: >60 mL/min (ref >60)
Potassium: 3.1 mEq/L — ABNORMAL LOW (ref 3.5–5.1)
Sodium: 142 mEq/L (ref 135–145)
Total Protein: 5.6 g/dL — ABNORMAL LOW (ref 6.0–8.3)

## 2011-03-13 LAB — CBC
HCT: 38.6 % — ABNORMAL LOW (ref 39.0–52.0)
HCT: 41.3 % (ref 39.0–52.0)
MCV: 86.8 fL (ref 78.0–100.0)
Platelets: 220 10*3/uL (ref 150–400)
RBC: 4.45 MIL/uL (ref 4.22–5.81)
RDW: 13.5 % (ref 11.5–15.5)
WBC: 11.8 10*3/uL — ABNORMAL HIGH (ref 4.0–10.5)

## 2011-03-13 LAB — GLUCOSE, CAPILLARY
Glucose-Capillary: 145 mg/dL — ABNORMAL HIGH (ref 70–99)
Glucose-Capillary: 155 mg/dL — ABNORMAL HIGH (ref 70–99)

## 2011-03-13 LAB — BASIC METABOLIC PANEL
Chloride: 109 mEq/L (ref 96–112)
Creatinine, Ser: 1.19 mg/dL (ref 0.4–1.5)
GFR calc Af Amer: >60 mL/min (ref >60)
Potassium: 4.3 mEq/L (ref 3.5–5.1)

## 2011-03-13 LAB — CULTURE, BLOOD (ROUTINE X 2): Culture: NO GROWTH

## 2011-04-10 NOTE — Discharge Summary (Signed)
NAMELUC, SHAMMAS NO.:  [id number]   MEDICAL RECORD NO.:  [id number]          PATIENT TYPE:  INP   LOCATION:  5016                         FACILITY:  [facility]   PHYSICIAN:  Oley Balm. Sung Amabile, MD   DATE OF BIRTH:  1966/05/05   DATE OF ADMISSION:  01/01/2009  DATE OF DISCHARGE:  01/03/2009                               DISCHARGE SUMMARY   ADMISSION DIAGNOSES:  1. Status asthmaticus.  2. Acute respiratory failure.  3. Transient hypotension.   DISCHARGE DIAGNOSES:  1. Status asthmaticus - resolved.  2. Acute respiratory failure - resolved.  3. Transient hypotension - resolved.   HISTORY OF PRESENT ILLNESS:  Please refer the admission history and  physical for this patient's initial presentation.  He has a longstanding  history of asthma.  In the remote past, he has been intubated on  multiple occasions.  Since he was started on Advair as this controller  medication, he had not required hospitalization or intubations.  Prior  to admission, he took some Advil Cold and Sinus medication for sinus  congestion.  Notably, he has a history of aspirin sensitivity.  Shortly  after taking the medication, he developed chest tightness and wheezing  and presented to the emergency department in severe respiratory  distress, essentially requesting intubation.  He was intubated in the  emergency department and admitted to the Pulmonary Critical Care  Medicine Service.   HOSPITAL COURSE:  He was initially admitted by Dr. [name].  He was  stabilized on mechanical ventilator and treated in the usual fashion  with systemic steroids and nebulized bronchodilators.  He was not  treated with antibiotics in the absence of respiratory secretions.  He  responded to these interventions extremely well and was extubated in the  morning of January 02, 2009.  He was transferred to the Intensive Care  Unit later that day and on the morning of this discharge is in markedly  improved condition  with no wheezing and no respiratory distress.  Oxygen  saturations are 97% on room air.   DISCHARGE MEDICATIONS:  1. Advair 250/50 one inhalation q.12 h.  2. Singulair 10 mg at bedtime.  3. Prednisone 10 mg alternating with 5 mg every other day until seen      in followup.  4. Albuterol inhaler 2 actuations q.4 h. p.r.n.   FURTHER CONSIDERATIONS:  Given what appears to be aspirin sensitivity, I  believe Mr. Furness should respond favorably to Singulair.  He further  informs me that he has been on prednisone at 10 mg a day for a long  time.  This was initiated many years ago because of his life-  threatening asthma.  I suspect that the prednisone can be tapered off  safely as long as he remains on the Advair and Singulair.  I suggest  that he remain on 10 mg alternating with 5 mg every other day for 1  month, then 5 mg every day for 1 month, then 5 mg alternating with no  prednisone every other day for 1 month, and then stop.  Ultimately, we  would  like him to followup with Dr. [name] for further management of his  asthma.   FOLLOWUP:  Presently, he is scheduled to see Tammy Parrett in the  [facility] Pulmonary Office on January 17, 2009 at 9:45 a.m.  Subsequent  followup will be with Dr. [name] or another pulmonologist who is  available.      Oley Balm Sung Amabile, MD  Electronically Signed     DBS/MEDQ  D:  01/03/2009  T:  01/04/2009  Job:  [id number]   cc:   Oretha Milch, MD

## 2011-04-10 NOTE — H&P (Signed)
NAMEGIANCARLO, [name] NO.:  [id number]   MEDICAL RECORD NO.:  [id number]          PATIENT TYPE:  INP   LOCATION:  2102                         FACILITY:  [facility]   PHYSICIAN:  Oretha Milch, MD      DATE OF BIRTH:  11-03-1966   DATE OF ADMISSION:  01/01/2009  DATE OF DISCHARGE:                              HISTORY & PHYSICAL   REASON FOR ADMISSION:  Status asthmaticus with respiratory failure.   HISTORY OF PRESENT ILLNESS:  [name] is currently intubated and  sedated and the history is obtained after reviewing his medical record  and e-chart and speaking to the ED medical staff.  He is a 45 year old  African American gentleman who has a history of severe persistent  asthma, that he has required intubation 4 times in the past, apparently  most recently was in 2004.  The onset of his symptoms is unclear to me.  He was brought in extremis with wheezing.  He was apparently not moving  air and was emergently intubated using etomidate and succinylcholine.  On my arrival, his initial blood gas was 7.08/74/531 post intubation and  chest x-ray showed hyperinflation without any infiltrates.  On my  arrival, he was being ventilated with a tidal volume of 600, FiO2 of  50%, and respiratory rate of 18 with a peak pressure of 49 and dropping  his respiratory rate to 12, an I:E ratio of 1:5 was achieved with a  decrease in PEEP.  Sedation protocol had been started with about 1 mg of  Versed and 25 mcg of fentanyl and we increased this to about 2 mg of  Versed and 15 mcg of fentanyl.  He did drop his blood pressure and a  fluid bolus was being given.   PAST MEDICAL HISTORY:  1. Hypertension.  2. Asthma as described above, he is steroid dependent.  3. Chronic sinusitis.   PAST SURGICAL HISTORY:  1. Vasectomy.  2. Left knee arthroscopy.  3. Deviated nasal septum surgery in 1998.  4. Nasal polypectomy in 2003.   Home medication list is unclear to me, but from his prior  discharge  summary, it seems to be Benicar/hydrochlorothiazide, Singulair, it is  unclear to me whether he takes Asmanex or Advair at home.   ALLERGIES:  NSAIDS worsen his asthma.   SOCIAL HISTORY:  He works as an Tax inspector for school.  He is  married and lives with his children.   FAMILY HISTORY:  His mother had hypertension and hyperlipidemia.  His  father is deceased at age 36 from a CVA.   REVIEW OF SYSTEMS:  Unobtainable at the current time since he is sedated  and intubated.   PHYSICAL EXAMINATION:  GENERAL:  Adult gentleman appears his stated age,  orally intubated with an orogastric tube.  VITAL SIGNS:  Blood pressure is 79/52 with a fluid bolus running in,  heart rate 102, sinus, on monitor, oxygen saturation 100% ventilator  settings, PRBC, tidal volume 600, respiratory rate of 12, 50%, PEEP of  5.  HEENT:  Pupils 2 mm, reactive to light.  NECK:  Supple.  No JVD.  No lymphadenopathy.  CVS:  S1 and S2, tachy chest, diffuse bilateral rhonchi, decreased  breath sounds, both bases.  ABDOMEN:  Soft, nontender.  EXTREMITIES:  No edema.  Good pulses.  NEUROLOGIC:  Sedated, paralytic wearing off, moving all 4 extremities.   LABORATORY DATA:  Pending at the time of dictation.   IMPRESSION:  1. Status asthmaticus.  2. Respiratory failure.  3. Transient hypotension related to high peak pressures on the      ventilator.   RECOMMENDATIONS:  1. 2 L of fluid bolus is being administered and the blood pressure      seems to be improving with that.  Maintenance fluids will be D5 NS      at 150 mL an hour.  2. Sedation protocol with Versed and fentanyl will be used.  He may      have to be restrained until good sedation is achieved  3. Ventilator settings will be PRBC/600/50%/PEEP of 5 with a      respiratory rate of 12 and good I:E ratio 1:5 is being achieved      with these settings.  We will repeat a blood gas in about 30      minutes and an anterior line will be  obtained.  4. IV Solu-Medrol 125 q.8 h. will be administered with albuterol nebs      every 2 hours as needed, Singulair 10 mg can be continued via the      OG tube, magnesium has been administered in the emergency room and.  5. CBGs will be checked q.4 h. and will be placed on the ICU      hyperglycemia protocol.  6. IV Protonix least for GI prophylaxis and subcu Lovenox will be used      for DVT prophylaxis.  Basic labs will be checked for electrolytes.      I do not see the need for antibiotics at the current time in the      absence of an infiltrate, but if his white count is high, we may      give him IV Avelox.   Total critical care time spent in the care of this patient including  ventilator management at the bedside was 60 minutes.      Oretha Milch, MD  Electronically Signed     RVA/MEDQ  D:  01/01/2009  T:  01/01/2009  Job:  [id number]

## 2011-04-10 NOTE — H&P (Signed)
NAMESERGI, [name]                 ACCOUNT NO.:  [id number]   MEDICAL RECORD NO.:  [id number]          PATIENT TYPE:  INP   LOCATION:  4707                         FACILITY:  [facility]   PHYSICIAN:  Carlena Hurl, MDDATE OF BIRTH:  15-Sep-1966   DATE OF ADMISSION:  02/10/2009  DATE OF DISCHARGE:                              HISTORY & PHYSICAL   CHIEF COMPLAINT:  Worsening shortness of breath and wheezing for the  past two days.   HISTORY OF PRESENT ILLNESS:  This is a 45 year old pleasant African  American male, who has a past medical history significant for severe  asthma with multiple hospital admissions with status post intubations in  the past, coming in with 2 days history of shortness of breath.  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  Then, he asked his  wife to get his nebulizer and so the patient received a nebulizer  treatment while at the school and as his symptoms did not improve, he  has decided to come to the ER.  As he had intubations in the past for  similar complaints, so he wanted to not to be in such condition, that is  why he has decided to come to the ER.  When the patient came into the  ER, he received 6 more treatments, as he still continued to have  worsening breathing.  It was then decided to admit him for observation.  This patient has been suffering with some sinusitis problem.  He says  that he has a history of chronic sinusitis and has been suffering lately  with maxillary sinusitis problems and also runny nose and dry cough, but  he never had any temperature spike at home or he denied any chills.  He  denies headache and blurry vision.  No nausea or vomiting.  No abdominal  pain.  No diarrhea.  No constipation.   PAST MEDICAL HISTORY:  Significant for;  1. Asthma and his recent admission was in February 2010; at which      time, the patient had a status asthmaticus with a respiratory      failure and underwent intubation.  According to him, he had so far      6 intubations in the past.  2. Hypertension.  3. Chronic sinusitis.   PAST SURGICAL HISTORY:  Left knee arthroscopy, vasectomy, deviated nasal  septal surgery in 1998, and nasal polypectomy in 2003.   MEDICATIONS:  The patient is on at home are; he takes chronic prednisone  and albuterol inhaler, Advair Diskus inhaler, albuterol nebulizer,  Benicar, and Singulair.   FAMILY HISTORY:  He says his mother had hypertension and hyperlipidemia.  Father deceased at age of 58 because of CVA.   REVIEW OF SYSTEMS:  Pretty much the same as history of present illness.   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  Denies smoking, alcohol abuse, or IV drug  abuse.   PHYSICAL EXAMINATION:  GENERAL:  This is a 45 year old gentleman who is  lying comfortably on the bed without any severe shortness of breath or  severe chest pain.  VITAL SIGNS:  When he came into the ER, blood pressure 169/80, which  later dropped to 150/89; pulse rate of 116, which later dropped to 110;  respiratory rate of 26; temperature 97.8, and saturating 97% on room  air.  HEENT:  Atraumatic and normocephalic.  Pupils; PERRLA.  Tympanic  membrane intact.  No discharge from the eyes or ears.  LUNGS:  Markedly decreased air entry bilaterally with a wheezing  bilaterally.  CVS:  S1 and S2 heard with increased heart rate.  ABDOMEN:  Soft.  Bowel sounds present.  Nontender and nondistended.  EXTREMITIES:  No pedal edema noted.  Pulse is palpable bilaterally.   LABORATORY DATA:  ABGs showed a PCO2 of 25.4, PO2 of 103, pH of 7.395,  bicarb of 15.5, and CO2 of  16.  The patient had a chest x-ray which  showed hyperaeration, but lungs are clear.   ASSESSMENT AND PLAN:  A 45 year old gentleman with a history of asthma,  now coming in with worsening shortness of breath and failed home  breathing treatment.  1. Severe asthma exacerbation.  As this patient has a significant past      history of status asthmaticus with multiple intubations in the      past, we are going to admit this patient and continue giving him      albuterol inhalers on a regular basis, as well as albuterol      nebulizers 2.5 mg every 6 hours and also every 2 hours as needed      for relief, and we are going to give him Advair Diskus 250/50 one      puff b.i.d.  We are going to start this patient on IV Solu-Medrol      60 mg IV 2 times a day.  The patient is saturating fine on room      air, so we will continue to check his pulse ox and use the oxygen      mask as needed to keep his oxygen saturations greater than 90%.  2. History of hypertension.  The patient's blood pressure is little      high when he came into the ER.  This could be because of his asthma      exacerbation.  He takes only Benicar at home, so we are going to      give Benicar hydrochlorothiazide, so we are going to give that      medication that he has been on at home, he says 12.5/20 and      continue to monitor his blood pressure.  3. Deep vein thrombosis prophylaxis.  Because of his significant      history of asthma, we are going to give him Lovenox 40 mg subcu 1      time a day.      Carlena Hurl, MD  Electronically Signed     JD/MEDQ  D:  02/11/2009  T:  02/11/2009  Job:  [id number]

## 2011-04-10 NOTE — Discharge Summary (Signed)
NAMEJAKEOB, TULLIS                 ACCOUNT NO.:  [id number]   MEDICAL RECORD NO.:  [id number]          PATIENT TYPE:  INP   LOCATION:  4707                         FACILITY:  [facility]   PHYSICIAN:  Hind I Elsaid, MD      DATE OF BIRTH:  05-Oct-1966   DATE OF ADMISSION:  02/10/2009  DATE OF DISCHARGE:  02/14/2009                               DISCHARGE SUMMARY   DISCHARGE DIAGNOSES:  1. Acute asthma exacerbation.  2. Hypertension.  3. Acute-on-chronic sinusitis with history of polypectomy and septal      deviation, status post correction.   MEDICATIONS:  1. Prednisone taper dose, start from 60 mg p.o. daily.  The patient on      chronic prednisone 10 mg p.o. daily.  2. Albuterol inhaler q.6 h. p.r.n.  3. Advair Diskus inhaler twice daily.  4. Benicar HCT 40/12.5 one tablet daily.  5. Singulair 10 mg daily.  6. Avelox 400 mg daily.  7. Flonase.   CONSULTATION:  Pulmonary was consulted.   HISTORY OF PRESENT ILLNESS:  This is a 45 year old male with known  history of severe asthma with multiple hospital admissions and status  post intubation in the past presented with shortness of breath and some  trouble breathing.   PROCEDURES:  1. Chest x-ray hyperinflation.  2. CT of sinuses, widespread advanced sinus inflammatory disease,      possibility of polyp coexisting does exist.   HOSPITAL COURSE:  Acute exacerbation of severe asthma.  The patient  admitted to the hospital, started on Solu-Medrol and nebulizer  treatment.  Pulmonary consulted.  The patient with history of chronic  sinusitis and history of nasal surgery.  Recommend CT limited for the  sinuses.  The patient was started next on IV steroid.  CT sinuses did  show evidence of severe sinus inflammation, rule out polyp.  During  hospital stay, the patient's symptoms significantly improved.  The  patient was started on Avelox.  Today, the patient has mild wheezing and  saturating 96% on room air.  The patient is not in  respiratory distress  or shortness of breath.  We felt the patient could be  discharged home with nebs treatment and prednisone taper dose in  addition to Avelox p.o.  The patient was advised to follow with ENT as  an outpatient for possible repeat curettage of his sinuses.  He is  scheduled to see Dr. [name] from [facility] on March 07, 2009, at 3:15 p.m.      Hind Bosie Helper, MD  Electronically Signed     HIE/MEDQ  D:  02/14/2009  T:  02/14/2009  Job:  [id number]

## 2011-04-11 ENCOUNTER — Ambulatory Visit (INDEPENDENT_AMBULATORY_CARE_PROVIDER_SITE_OTHER): Payer: BC Managed Care – PPO

## 2011-04-11 DIAGNOSIS — J45909 Unspecified asthma, uncomplicated: Secondary | ICD-10-CM

## 2011-04-11 MED ORDER — OMALIZUMAB 150 MG ~~LOC~~ SOLR
300.0000 mg | Freq: Once | SUBCUTANEOUS | Status: AC
  Administered 2011-04-11: 300 mg via SUBCUTANEOUS

## 2011-04-13 NOTE — Discharge Summary (Signed)
[facility]  Patient:    CAULDER, WEHNER Visit Number: [id number] MRN: [id number]          Service Type: MED Location: 2S [id number] Attending Physician:  Merwyn Katos Dictated by:   Earley Favor, RN, MSN, ACNP Admit Date:  12/08/2001 Disc. Date: 12/11/01   CC:         Veverly Fells. Arletha Grippe, M.D.   Discharge Summary  DATE OF BIRTH:  December 15, 1965  DISCHARGE DIAGNOSES: 1. Acute exacerbation of asthma with vent dependent respiratory failure. 2. Chronic sinusitis.  HISTORY OF PRESENT ILLNESS:  Mr. Lipford is a 45 year old African-American male who presented to the Emergency Department at [facility] in acute respiratory distress that required orotracheal intubation.  Of note, this is the fourth intubation for this gentleman due to his history of asthma.  His most recent intubation was in 1999 and he was last hospitalized December 2000 with asthma exacerbation.  He has been a patient of Dr. Sung Amabile, but has not been following Dr. Sung Amabile on a regular basis.  Reportedly, he has been utilizing nebulized albuterol MDI problematically at home and became more and more dyspneic until he presented with and required urgent intubation.  LABORATORY DATA:  Arterial blood gas on 100% nonrebreather oxygen mask:  pH 7.38, pCO2 50, pO2 47.9.  Sodium 141, potassium 3.0, chloride 107, CO2 29, glucose 134, BUN 16, creatinine 1.2, calcium 7.8, alkaline phosphatase 28. WBC 11.3, hemoglobin 14.6, hematocrit 42.3, platelets 239,000.  Arterial blood gases on December 08, 2001 on ventilator:  100% FiO2, total volume 650, rate 12, PEEP 5, pH 7.28, pCO2 48, pO2 469, bicarbonate 31.4.  Repeat basic metabolic package:  Potassium increased to 4.4 with glucose 201.  Limited CT of the sinuses demonstrated extensive chronic paranasal sinus disease with near complete opacification of the frontal ethmoid sinus bilateral with questionable underlying polyposis.  Portable chest:   Lungs remained clear.  Endotracheal is protected in satisfactory position.  Initial chest x-ray showed intubation without acute abnormalities.  PROCEDURES:  Pulmonary function tests with diffusing lung capacities.  FVC 4.64 which is 94% of predicted.  FEV1 is 3.75 which is 75% of predicted. FEV1/FVC percent is 74%.  FEV25/75 is 1.59 which is 33%.  The residual volume is noted to be 2.53 which is 123% of predicted.  Diffusing lung capacity showed a DLCO of 97% with a DLCO VA of 106%.  HOSPITAL COURSE:  #1 -  ACUTE EXACERBATION OF ASTHMA WITH STATUS ASTHMATICUS REQUIRING VENTILATORY SUPPORT:  Mr. Eckert presented to the emergency department requiring urgent intubation secondary to hypoxia.  He required mechanical ventilatory support with intubation on January 13 with extubation on December 09, 2001.  His pulmonary status improved remarkably with IV steroids, nebulized bronchodilators, along with antimicrobial therapy.  By December 11, 2001 he was ready for discharge home with no evidence of the status asthmaticus that had required mechanical ventilatory support.  #2 - DIFFICULT TO CONTROL ASTHMA:  Mr. Hallstrom obviously has difficult to control asthma secondary to the fact that he has been intubated four times. He was given very definite instructions on the appropriate use of his pharmaceutical interventions and he will be followed more frequently utilizing nurse practitioner and the physician to maintain proper pulmonary health. Note that his peak flows were running 600-650 on discharge.  #3 - CHRONIC SINUSITIS:  Of note, he has been followed by Dr. Arletha Grippe in the past and had nasal surgery for polyps.  Limited CT demonstrated chronic sinusitis.  For that reason he was changed from Ceftin b.i.d. to Augmentin 875 mg b.i.d. and given a course of nasal hygiene consistent of Flonase along with sodium chloride nasal spray on a regimen as scheduled.  He has also been instructed to follow up with  Dr. Arletha Grippe.  A copy of this will be sent to Dr. Arletha Grippe to facilitate this examination in the near future.  DISCHARGE MEDICATIONS: 1. Advair 550 one puff b.i.d. 2. Augmentin 875 mg one b.i.d. for 14 days. 3. Singulair 10 mg one q.d. 4. Protonix 40 mg one q.d. 5. Prednisone on taper 40 mg for two days, 30 mg for two days, 20 mg for two days, 10 mg for two days, then stop. 6. Flonase one puff b.i.d. 7. Nasal salt water two puffs q.i.d. 8. Albuterol puffer only as a rescue medication.  Given specific instructions should he be using his utilizing albuterol puffer to keep a record and to bring it to the office visits with him.  DIET:  No restriction.  SPECIAL INSTRUCTIONS:  Check peak flows daily.  Keep a record of the number of times he uses albuterol puffer as a rescue medication.  He has a follow-up appointment with nurse practitioner Minor December 18, 2001 at 2:45 p.m.  DISPOSITION/CONDITION ON DISCHARGE:  Acute exacerbation of status asthmaticus. Has been resolved.  He has been extubated.  His pulmonary status has returned to normal pulmonary baseline.  Of note, his pulmonary function tests do demonstrate a mild component of obstruction and he does have a history of tobacco abuse, although he quit years ago. Dictated by:   Earley Favor, RN, MSN, ACNP Attending Physician:  Merwyn Katos DD:  12/11/01 TD:  12/11/01 Job: [id number]

## 2011-04-13 NOTE — H&P (Signed)
NAME:  [name], [name]                           ACCOUNT NO.:  [id number]   MEDICAL RECORD NO.:  [id number]                   PATIENT TYPE:  EMS   LOCATION:  ED                                   FACILITY:  [facility]   PHYSICIAN:  Marcelyn Bruins, M.D. [facility]              DATE OF BIRTH:  06/24/1966   DATE OF ADMISSION:  07/06/2003  DATE OF DISCHARGE:                                HISTORY & PHYSICAL   HISTORY OF PRESENT ILLNESS:  The patient is a 45 year old black male known  to [place] with very severe asthma and usually exacerbated by medical  noncompliance.  The patient has decided to take a holistic approach to his  asthma with diet and exercise and is only using p.r.n. albuterol, but the  question has been raised whether he is on prednisone 10 daily.  It is very  difficult to get a history from him because he is an extremist, but he comes  in today with severe respiratory distress and status asthmaticus.  He does  have some scant brown mucus from this morning with cough.  The patient has  received p.o. prednisone and is on a continuous albuterol treatment and now  is on BiPAP.  The patient feels that he is getting tired and will ultimately  wear out soon.   PAST MEDICAL HISTORY:  1. Significant for his severe asthma.  2. History of chronic sinusitis with nasal polyposis.   ALLERGIES:  The patient has no known drug allergies.   Social history, family history, and review of systems are unable to be  obtained.   PHYSICAL EXAMINATION:  GENERAL:  The patient is a well-developed in very  extreme respiratory distress with 3+ accessory muscle use.  VITAL SIGNS:  Heart rate is 120 and regular.  Respiratory rate is in the  40s.  Systolic blood pressure is approximately 160.  HEENT:  Pupils equal, round, and reactive to light and accommodation.  Extraocular muscles are intact.  Nares are unable to be assessed secondary  to BiPAP.  Oropharynx is unable to be assessed secondary to BiPAP.  NECK:   Supple without JVD or lymphadenopathy.  There is palpable  thyromegaly.  CHEST:  Reveals very poor air movement.  He has expiratory wheezes  throughout.  CARDIAC:  Reveals tachycardic regular rhythm.  ABDOMEN:  Soft, nontender with good bowel sounds.  RECTAL/BREASTS/GENITALIA:  Not done, not indicated.  EXTREMITIES:  Lower extremities are without edema, good pulses distally.  No  calf tenderness.  NEUROLOGIC:  He is alert and oriented with no neurologic deficits.   LABORATORY DATA:  Chest x-ray shows hyperinflation with no infiltrate.   Arterial blood gases and labs are pending at time of dictation.   IMPRESSION:  Severe acute respiratory distress secondary to status  asthmaticus.  The patient has a very likelihood he will need endotracheal  intubation.  At this point in time he is  going to need ICU admission and  aggressive treatment of his status asthmaticus.   PLAN:  1. Start magnesium sulfate drip ASAP as well as subcu epinephrine.  2.     Continue continuous albuterol treatments and IV Solu-Medrol.  3. Empiric antibiotics for possible tracheal bronchitis/sinusitis.  4. Admit to the ICU and load threshold for endotracheal intubation.                                                    Marcelyn Bruins, M.D. [facility]    KC/MEDQ  D:  07/06/2003  T:  07/06/2003  Job:  [id number]   cc:   Oley Balm. Sung Amabile, M.D. [facility]

## 2011-04-13 NOTE — Discharge Summary (Signed)
[name], [name]                 ACCOUNT NO.:  [id number]   MEDICAL RECORD NO.:  [id number]          PATIENT TYPE:  INP   LOCATION:  3012                         FACILITY:  [facility]   PHYSICIAN:  Mobolaji B. Bakare, M.D.DATE OF BIRTH:  1965/12/11   DATE OF ADMISSION:  01/18/2007  DATE OF DISCHARGE:  01/20/2007                               DISCHARGE SUMMARY   PRIMARY CARE PHYSICIAN:  Dr. Andi Devon.   FINAL DIAGNOSIS:  Acute asthma exacerbation.   PROCEDURE:  Chest x-ray showed no active cardiopulmonary disease.   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  He was in  his usual state of health until the morning of admission when he  developed further onset of diarrhea, nausea, and vomiting.  This was  also complicated by acute shortness of breath.  He tried using his  nebulizer at home.  It did not improve the symptoms.  He had a  temperature of 103.  There was no abdominal pain or melenic stool.  Apparently, the patient has been in contact with school children who  have been sick with both upper respiratory infection and  gastroenteritis.  He came to the hospital.  He was evaluated in the  emergency room.  Influenza antigen was negative.  Of note is that the  patient has had to be intubated and ventilated on four occasions  previously for asthma exacerbation.  He was started promptly on  nebulizer in the emergency room and IV Solu-Medrol.  He also receive  intravenous magnesium.  The patient responded fairly rapidly, and he did  not have to use BiPAP, was admitted to regular floor.   Asthma exacerbation.  The patient turned around quite quickly within 24  hours.  His lungs were clear without any wheeze or rhonchi.  He was  continued on IV Solu-Medrol nebulizer around the clock, and he continued  to remain stable.  He was discharged when back to his baseline.  Solu-  Medrol was changed to  prednisone which he would taper off to 10 mg and  continue this dose.   Nausea, vomiting, and diarrhea these resolved upon admission.  He was  treated with IV fluids, and there was no stools available for culture  and did not have anymore fever.  His episode was quite transient and  believed to be a viral gastroenteritis.   DISCHARGE MEDICATIONS:  1. Prednisone 40 mg for three days, 30 mg for three days, 20 mg for      three days, and then 10 mg daily.  2. Benicar 20/12.5 1 daily.  3. Singulair 10 mg daily.  4. Asmanex 1 puff daily.  5. Albuterol MDI p.r.n.  6. Zantac p.r.n.   ADMISSION DISCHARGE CONDITION:  The patient was stable.   DISCHARGE LABORATORY DATA:  White cells 6.3, hemoglobin 12.7, platelets  218.  Streptococcus pneumonia, urine antigen negative, Legionella immune  antigen negative.  Blood cultures negative.  BUN 11, creatinine 1.06,  sodium 141, potassium 3.7.   The patient is to follow up with  Dr. Renae Gloss in one to two weeks.      Mobolaji B. Corky Downs, M.D.  Electronically Signed     MBB/MEDQ  D:  01/20/2007  T:  01/20/2007  Job:  [id number]   cc:   Merlene Laughter. Renae Gloss, M.D.

## 2011-04-13 NOTE — Discharge Summary (Signed)
NAME:  [name], [name]                           ACCOUNT NO.:  [id number]   MEDICAL RECORD NO.:  [id number]                   PATIENT TYPE:  INP   LOCATION:  0374                                 FACILITY:  [facility]   PHYSICIAN:  Oley Balm. Sung Amabile, M.D. [facility]          DATE OF BIRTH:  04-Nov-1966   DATE OF ADMISSION:  07/06/2003  DATE OF DISCHARGE:  07/09/2003                                 DISCHARGE SUMMARY   DISCHARGE DIAGNOSES:  1. Status asthmaticus requiring mechanical ventilatory support and     orotracheal intubation.  2. Steroid-induced hyperglycemia.  3. Cough along with productive yellow sputum.   HISTORY OF PRESENT ILLNESS:  Mr. [name] is a 45 year old African  American male with known severe asthma who has been intubated four times  prior to this admission.  He attempted a holistic approach to his asthma  with diet and exercise.  He was only using p.r.n. albuterol, but presented  to the emergency department in status asthmaticus requiring orotracheal  intubation and mechanical ventilatory support.   LABORATORY DATA:  The IgE is 248.8.  Sodium 141, potassium 4.1, chloride  110, CO2 28, glucose 162, BUN 19, creatinine 1.0, calcium 8.3.  Arterial  blood gas on 100% FIO2 on a ventilator with ________ volume control with pH  7.30, pCO2 41, and pO2 125.  WBC 12.8, hemoglobin 13.3, hematocrit 40.1,  platelets 225.  AST 42, ALT 25.  Chest x-ray showed hyperinflation with  appropriate placement of endotracheal tube.  Followup chest x-ray showed no  acute disease and endotracheal tube removed.   HOSPITAL COURSE:  #1 - STATUS ASTHMATICUS REQUIRING MECHANICAL VENTILATORY  SUPPORT:  Mr. [name] presented to the emergency department in acute  status asthmaticus which proved refractory to bronchodilators.  He required  orotracheal intubation and mechanical ventilatory support with heavy  sedation overnight.  After being started on high-dose IV steroids and  nebulized  bronchodilators, he was able to be extubated the following day  within 24 hours.  He had an uneventful hospital course and was ready for  discharge home on July 09, 2003.   #2 - PURULENT YELLOW SPUTUM WITH COUGH AND HICCUPS:  Mr. [name] developed the  aforementioned symptoms after being intubated in the emergency department.  Therefore, he was placed on a Z-Pak, Tussionex, and proton pump inhibitor  and will be followed up on an outpatient basis within five days.   DISCHARGE MEDICATIONS:  1. Advair ______ 1 b.i.d.  2. Albuterol inhaler p.r.n. as needed.  3. Prednisone on taper 20 mg tablets, three tablets for three days, two     tablets for three days, one tablet for three days, and then go to 10 mg a     day.  4. Singulair 10 mg a day.  5. Protonix 40 mg daily a day.  6. Tussionex one teaspoon q.12h. p.r.n.  7. Z-Pak take as directed.   DIET:  No restrictions.   SPECIAL INSTRUCTIONS:  Bring all of his medications to his office visit.   FOLLOWUP:  He has an appointment with Brett Canales Minor, A.C.N.P., on July 15, 2003, at 2:45 p.m. and with Onalee Hua B. Sung Amabile, M.D., on August 03, 2003, at  10:45 a.m.   DISPOSITION/CONDITION ON DISCHARGE:  The status asthmaticus has broke.  He  is now extubated and returned to his normal pulmonary baseline.  He is being  discharged home in improved condition.     Brett Canales Minor, A.C.N.P. [facility]                 Oley Balm. Sung Amabile, M.D. [facility]    SM/MEDQ  D:  07/09/2003  T:  07/09/2003  Job:  [id number]

## 2011-04-13 NOTE — H&P (Signed)
[name], [name] NO.:  [id number]   MEDICAL RECORD NO.:  [id number]          PATIENT TYPE:  EMS   LOCATION:  ED                           FACILITY:  [facility]   PHYSICIAN:  [name], M.D.    DATE OF BIRTH:  1966-03-23   DATE OF ADMISSION:  04/15/2005  DATE OF DISCHARGE:                                HISTORY & PHYSICAL   CHIEF COMPLAINT:  Wheezing, shortness of breath, nasal congestion.   HISTORY OF PRESENT ILLNESS:  The patient is a 45 year old man with a past  medical history significant for asthma and ventilator-dependent respiratory  failure x4, chronic sinusitis, and hypertension, who presented to the  emergency department with a 3-4 day history of wheezing, shortness of  breath, and nasal congestion.  The patient also has had a productive cough  with yellow sputum.  The patient has also had nasal congestion with purulent  drainage.  He has had some mild sinus discomfort over the maxillary sinuses;  however, this has more or less resolved.  He denies subjective fever and  chills.  He has been using his albuterol nebulizer more frequently over the  past 2-3 days.  Usually, he needs the albuterol nebulizer only 2-3 times per  week; however, over the past few days, he has been using it 3-4 times daily.  The patient also uses an albuterol M.D.I. on a regular basis.  He usually  needs 2 puffs only once daily.  However, over the past few days, he has been  using the albuterol M.D.I. primarily when he leaves the house.  He has been  using it at least 2-3 times daily.  The patient has chronic sinusitis, as  was diagnosed several years ago.  The patient was actually referred to ear,  nose, and throat physician, Dr. Annalee Genta, on Apr 10, 2005; however, he  missed the appointment.  The patient plans to reschedule the appointment.  The patient is also treated with chronic prednisone therapy.  He has been  progressively weaned down to 10 mg daily.  His medication  regimen also  includes Flovent 2 puffs b.i.d., as well as a steroid nasal spray once  daily.   When the patient was evaluated in the emergency department, he was noted to  be wheezing quite audibly.  He was afebrile and hemodynamically stable.  He  was initially oxygenating 91% on two liters of nasal cannula oxygen.  The  patient was treated with Solu-Medrol and a continuous albuterol nebulizer  times one hour.  The patient was also given 2 gm of magnesium sulfate.  He  states that he feels somewhat better now.  However, given the patient's  impression past medical history, he will be admitted for further evaluation  and management.   PAST MEDICAL HISTORY:  1.  Asthma with a history of frequent asthma exacerbations.  The patient has      a history of ventilator-dependent respiratory failure times four in the      past.  The last time the patient was intubated was in 2004.  2.  Chronic sinusitis.  3.  Status post deviated septum surgery in 1998.  4.  Status post nasal polypectomy in 2003.  5.  Hypertension.  6.  Status post left knee arthroscopic surgery in 2005.  7.  Status post vasectomy in 2002.   MEDICATIONS:  1.  Prednisone 10 mg daily.  2.  Flovent two puffs b.i.d.  3.  Albuterol nebulizer, usually 2-3 times per week.  4.  Albuterol M.D.I. two puffs daily.  5.  Steroid nasal spray, question name, one spray in each nostril once      daily.  6.  Benicar, question dose, 20 versus 40 mg daily.  7.  A variety of vitamin supplements.   ALLERGIES:  The patient has no known drug allergies, although he does have  intolerances to -  1.  ASPIRIN.  2.  NSAIDS.  The patient states that when he takes NSAIDS, his asthma      worsens.   SOCIAL HISTORY:  The patient is married.  He is a Gaffer.  He  denies tobacco, alcohol, and illicit drug use.  he has 2 children.   FAMILY HISTORY:  His father died at 7 years of age secondary to a stroke.  His mother is still alive.  She  is 35 years of age, and has a history of  hypertension and hyperlipidemia.   REVIEW OF SYSTEMS:  The patient's review of systems is positive for chronic  nasal congestion, occasional sinus headache, shortness of breath only when  his asthma flares up, occasional arthritis in his left knee.  The review of  systems is also positive for a sore throat.  Otherwise, the review of  systems is negative.   PHYSICAL EXAMINATION:  VITAL SIGNS:  Temperature 98.4, blood pressure  155/82, pulse 98, respiratory rate 22, oxygen saturation 91% on 2 liters,  repeated at 96% on 2 liters.  GENERAL:  The patient is a pleasant 45 year old African-American man who is  currently sitting up in bed in no acute distress at the moment.  HEENT:  Head is normocephalic and atraumatic.  Pupils equal, round and  reactive to light.  Extraocular movements are intact.  Conjunctivae are  clear.  Sclerae are white.  Tympanic membranes are clear bilaterally.  Nasal  mucosa is edematous, but no active purulent drainage.  No sinus tenderness.  Oropharynx reveals mildly dry mucous membranes.  Teeth are in good repair.  No posterior exudates or erythema or edema.  NECK:  Supple.  No adenopathy, no thyromegaly, no bruit, no JVD.  LUNGS:  The patient has a few scattered expiratory wheezes throughout all  lung fields.  No crackles.  Breathing is nonlabored (the lung exam was  following one hour of continuous albuterol nebulizer treatment).  HEART:  S1 and S2 with mild tachycardia.  ABDOMEN:  Mildly obese, positive bowel sounds, soft, nontender,  nondistended.  No hepatosplenomegaly.  EXTREMITIES:  Pedal pulses are 2+ bilaterally.  No pretibial edema, no pedal  edema.  The patient has a good range of motion of all of his extremities.  GU/RECTAL:  Deferred.  NEUROLOGIC:  The patient is alert and oriented x3.  Cranial nerves II-XII  are intact.  Strength is 5/5 throughout.  Sensation is intact.  ADMISSION LABORATORIES:  Chest x-ray  reveals moderate peribronchial  thickening.  No focal lung opacities.  WBC 10.6, hemoglobin 14.1, hematocrit  41.9, MCV 84.9, platelets 271.  Sodium 144, potassium 3.1, chloride 106, CO2  of 29, glucose 123, BUN 9, creatinine 1.3, calcium 8.9.  ABG on two liters  revealed a pH of 7.4, PCO2 of 42, potassium of 65.   ASSESSMENT:  1.  Asthma exacerbation.  The patient does not appear to be in acute      respiratory distress currently.  The treatments in the emergency      department have been effective.  The patient is less symptomatic;      however, he will be admitted for further observation, given his      significant past medical history.  2.  Acute on Chronic sinusitis and allergic rhinitis.  The patient plans to      be reschedule an outpatient appointment with ENT, Dr. Annalee Genta.  3.  Hypokalemia.  The hypokalemia is probably secondary to albuterol      treatments.   PLAN:  1.  As stated above, the patient received 125 mg of Solu-Medrol IV, one hour      albuterol nebulizer treatment, and 2 gm of IV magnesium sulfate while in      the emergency department.  2.  Treatment will continue with Solu-Medrol 80 mg IV q.6h.  The patient      will be transitioned over to a prednisone taper, and then he should      resume his usual dose of prednisone at 10 mg daily following the taper.  3.  Albuterol and Atrovent nebulizers q.4h., and albuterol nebulizer q.2h.      p.r.n.  4.  Nasonex nasal spray once daily, as well as Afrin nasal spray q.12h.      p.r.n.  5.  Antihistamine treatment with Claritin 10 mg daily.  Will continue      Flovent one inhalation twice daily.  6.  Symptomatic treatment with Tussionex and Tessalon Perles.  7.  Will start antibiotic treatment empirically with Avelox 400 mg daily.  8.  Will check a sputum for Gram stain C&S.  9.  Will replete potassium chloride.  Will follow BMET daily for further      management.  10. Will expect the patient's blood glucose to  increase, and will therefore      start empiric insulin treatment with a sliding scale insulin regimen, as      well as Lantus.  11. Prophylactic Protonix 40 mg daily.  12. Humidified oxygen at 2 liters per minute.      DF/MEDQ  D:  04/16/2005  T:  04/16/2005  Job:  [id number]   cc:   Merlene Laughter. Renae Gloss, M.D.  [address]  [address]  [place]  [place] [address]  Fax: [phone]

## 2011-04-13 NOTE — H&P (Signed)
[facility]  Patient:    [name], [name] Visit Number: [id number] MRN: [id number]          Service Type: MED Location: 2S [phone] Attending Physician:  Merwyn Katos Dictated by:   Oley Balm Sung Amabile, M.D. [facility] Admit Date:  12/08/2001                           History and Physical  DATE OF BIRTH:  10/08/1966  ADMISSION DIAGNOSIS:  Status asthmaticus with respiratory failure.  HISTORY OF PRESENT ILLNESS:  Mr. Salvato is presently intubated.  Therefore, most of the history is derived from medical records.  I have seen him in the office previously for severe asthma.  He has a history of intubation on two previous occasions, most recently in 1999.  He was last hospitalized in December of 2000 with an asthma exacerbation.  I have not seen him in the office for several months.  He apparently presented in status asthmaticus and was unable to provide any history prior to intubation.  The report was that he was moving very little air.  He had used a nebulized bronchodilator on a couple of occasions at home and had received nebulized Albuterol en route. There is no reported history of fever or sputum production.  There is no reported history of pleuritic or anginal chest pain.  At his baseline, he has nearly normal lung function.  PAST MEDICAL HISTORY:  Otherwise unremarkable.  LISTED MEDICATIONS:  Reportedly he takes Advair, Singulair, prednisone 10 mg p.o. q.d., albuterol, and Zantac.  SOCIAL HISTORY:  He reportedly quit smoking in approximately 1999.  I am not certain if he has smoked since that time.  There is documentation of narcotic use in the past.  I am uncertain of the details regarding this.  FAMILY HISTORY:  This is not available.  REVIEW OF SYSTEMS:  This is not available.  PHYSICAL EXAMINATION:  He is young appearing, intubated, and sedated on a ventilator.  VITAL SIGNS:  Upon arrival, his blood pressure was 106/53, pulse 121, respirations  28, and temperature 102.1 degrees.  HEENT:  No acute abnormalities.  NECK:  No acute abnormalities.  CHEST:  Exam on the ventilator revealed decent air movement with a few scattered expiratory wheezes and no evidence of gas trapping.  No findings of consolidation, or dependent crackles are noted.  CARDIAC:  Hyperdynamic precordium with no murmurs or extra heart sounds.  ABDOMEN:  Soft with normal bowel sounds.  There is no palpable organomegaly. NG aspirate demonstrates bilious material.  EXTREMITIES:  No clubbing, cyanosis, or edema.  NEUROLOGIC:  Exam could not be performed due to the presence of neuromuscular blockers.  LABORATORY DATA:  The chest x-ray revealed mild to moderate hyperinflation with no acute infiltrates nor edema.  The arterial blood gas on the ventilator revealed a pH of 7.28 with a pCO2 of 68 and a pO2 of 470.  The ventilator settings are a tidal volume of 650 with a rate of 12, a PEEP of 5, and FiO2 of 100%.  Other laboratory data of relevance is a potassium of 3.0.  Chemistries are otherwise normal.  The CBC is unremarkable.  IMPRESSION: 1. Status asthmaticus. 2. Ventilator-dependent respiratory failure secondary to above. 3. History of intubation on two occasions previously.  I am not certain if he    has been compliant with the medical regimen as outlined above and this will    certainly have to  be discussed with him at length prior to discharge given    the life threatening nature of these events.  PLAN:  He will be maintained on mechanical ventilation overnight.  The way he sounds now suggests to me that he might be able to be extubated the morning following admission.  He will be treated with intravenous corticosteroids and nebulized bronchodilators.  Because of the fever, empiric ceftriaxone will be used.  A repeat chest film will be obtained the morning following admission to ensure that a pneumonia is not developing.  Routine DVT and GI  prophylaxis will be provided.  Saturation will be provided with Propofol because it also has a bronchodilator effect.  Hopefully we can avoid the further use of neuromuscular blockade. Dictated by:   Oley Balm Sung Amabile, M.D. [facility] Attending Physician:  Merwyn Katos DD:  12/08/01 TD:  12/09/01 Job: [id number]

## 2011-04-13 NOTE — Discharge Summary (Signed)
NAME:  [name], WHALIN                           ACCOUNT NO.:  [id number]   MEDICAL RECORD NO.:  [id number]                   PATIENT TYPE:  INP   LOCATION:  5023                                 FACILITY:  [facility]   PHYSICIAN:  Danice Goltz, M.D. [facility]            DATE OF BIRTH:  1966/07/26   DATE OF ADMISSION:  01/08/2003  DATE OF DISCHARGE:  01/10/2003                                 DISCHARGE SUMMARY   DISCHARGE DIAGNOSES:  1. Status asthmaticus.  2. Asthma.   HISTORY OF PRESENT ILLNESS:  The patient is a 45 year old African-American  male with a history of severe asthma and multiple intubations.  He was doing  well until earlier on January 08, 2003 when he developed a headache while  at work and took [employer] headache powders.  He had last taken one of these a  long time ago before his presentation of asthma, within approximately 20  minutes he developed severe shortness of breath and wheezing.  He was at  work and they called 911 for transport to the emergency department.   PAST MEDICAL HISTORY:  Significant for sinus problems and asthma.  PFTs in  October 2000 showed FEV1 of 2.51 and FVC of 4.13 which demonstrated moderate  obstruction.   ALLERGIES:  None other than the exception of ASPIRIN.   LABORATORY DATA:  Arterial blood gases on room air:  pH 7.36, pCO2 36, pO2  111.  WBC 8.2, hemoglobin 13.8, hematocrit 40.6, platelets 3209.  Sodium  140, potassium 4.3, chloride 107, CO2 24, glucose 181, BUN 18, creatinine  1.1, calcium 9.1.   HOSPITAL COURSE:  #1 - STATUS ASTHMATICUS:  The patient was transported to  the emergency department of [facility].  He was placed on  albuterol nebulizer which was changed to Xopenex.  He was also given IV  steroids and placed on bipressure ventilation, nonmechanical ventilatory  support.  Initial gases showed pH 7.39, pCO2 45 and pO2 53, improved to  gases that were noted on discharge.  He responded well to treatment and was  ready for discharge home on January 10, 2003 and was discharged home via  Dr. Marcelyn Bruins.   DISCHARGE MEDICATIONS:  1. Singulair 10 mg p.o. b.i.d.  2. Advair 50/500 one puff b.i.d.  3. Xanax 150 mg b.i.d.  4. Albuterol 2.5 mg b.i.d.  5. Flovent 220 MDI two puffs p.r.n.  6. Flonase two puffs via mouth inhaler.  7. Prednisone taper 20 mg tablets 40 mg a day for three days, 30 mg a day     for three, 20 mg a day for three days and then stop.  8. Darvocet-N 100 for headaches.   SPECIAL INSTRUCTIONS:  No aspirin or other nonsteroidal anti-inflammatories.  He will follow up with Dr. Jayme Cloud in two weeks.    DISPOSITION AND CONDITION ON DISCHARGE:  Improved.  Diet was no change.  He  was  discharged home in improved condition.     Brett Canales Minor, A.C.N.P. [facility]                 Danice Goltz, M.D. [facility]    SM/MEDQ  D:  03/10/2003  T:  03/10/2003  Job:  [id number]   cc:   Olene Craven, M.D.  [address]  [address]  [place]  [place] [address]  Fax: [phone]

## 2011-04-13 NOTE — H&P (Signed)
NAMECHAZ, MCGLASSON NO.:  [id number]   MEDICAL RECORD NO.:  [id number]          PATIENT TYPE:  INP   LOCATION:  1827                         FACILITY:  [facility]   PHYSICIAN:  Manning Charity, MD     DATE OF BIRTH:  [birth year]   DATE OF ADMISSION:  01/18/2007  DATE OF DISCHARGE:                              HISTORY & PHYSICAL   HISTORY OF PRESENT ILLNESS:  Mr. Hislop is a 45 year old gentleman with a  history of steroid-dependent asthma for which he has required intubation  four times in the past, most recently in 2004.  He was in his usual  state of health until this morning, when he developed sudden onset of  diarrhea, followed by nausea and vomiting.  He then developed acute  shortness of breath which was not responding to his nebulizer treatments  and a fever with a maximum temperature of 103.  He denies any abdominal  pain, hematochezia, or hematemesis.  He did, however, have one episode  of black liquid stools.  He denies any chest pain.  He has not had any  cough.  No shortness of breath or need for his albuterol inhaler before  today.  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   PAST MEDICAL HISTORY:  1. Asthma with a history of frequent exacerbations and ventilator-      dependent respiratory failure.  He believes he has had four prior      intubations and thinks his last one was in 2004.  2. Chronic sinusitis for which he has had surgery and for which he has      seen an ears, nose, and throat specialist.  3. Hypertension.   PAST SURGICAL HISTORY:  1. Nasal polypectomy in 2003.  2. Deviated septum surgery in 1998.  3. Left knee arthroscopy.  4. Vasectomy.   HOME MEDICATIONS:  1. Prednisone 10 mg daily.  2. Singulair 10 mg daily.  3. Asthmanex 1 puff daily.  4. Benicar, he  believes 20/12.5 daily.  5. Albuterol inhaler and nebulizers as needed.   DRUG INTOLERANCES:  HE BELIEVES NSAIDs WORSEN HIS ASTHMA. NO OTHER KNOWN  ALLERGIES OR INTOLERANCES.   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  He is married.  Denies any alcohol, drugs use and does not smoke.   FAMILY HISTORY:  His father is deceased at the age of 52 from a CVA.  His mother has hypertension and hyperlipidemia.  No other known health  problems in his family.   REVIEW OF SYSTEMS:  Positive for myalgias and headaches which began the  day prior to admission. Otherwise, diffusely negative except as in the  HPI.   PHYSICAL EXAMINATION:  VITAL SIGNS:  In the emergency room were with a  temperature of 103, heart rate 119, blood pressure initially was 95/58,  with the repeat being  85/43, respiratory rate of 24, sating 92% on 4  liters.  GENERAL:  He is in mild distress secondary to tachypnea and diffuse  discomfort.  HEENT:  He has dry mucous membranes.  HEART:  He is tachycardic with no appreciable murmurs, rubs, or gallops.  RESPIRATORY:  With poor air movement and diffuse expiratory wheezing.  ABDOMEN:  Soft with mild diffuse tenderness.  Slightly decreased bowel  sounds but no rebound, no guarding.  EXTREMITIES:  Without any edema or cyanosis.  RECTAL:  Deferred.  NEUROLOGIC:  The patient was alert and oriented x3.  Cranial nerves II-  XII are grossly intact.  There were no focal deficits noted.   ADMISSION LABORATORY:  CBC with a white blood cell count of 11.1 with no  differential, hemoglobin 14.2, platelet count 239.  B-MET shows a sodium  of 142, potassium 3, chloride 104, bicarbonate 29, BUN 16, creatinine  1.3, glucose 94.  Bilirubin 1, alkaline phosphatase 34, AST 17, ALT 19,  albumin 3.6.  A urinalysis is within normal limits.  And chest x-ray is  without any acute focal findings.   ASSESSMENT:  1. Asthma exacerbation.  Poor air movement on exam  accompanied by      tachypnea and poor oxygen saturation on 4 liters is very      concerning, especially in light of his history of multiple episodes      of ventilator-dependent respiratory failure.  For that reason, we      are going to watch him in the stepdown unit, continue his      nebulizers, Solu-Medrol, and magnesium intravenous.  We will      monitor his status very closely for the need for intubation or      BiPAP.  2. Hypotension.  Believe this most likely represents dehydration from      his multiple episodes of nausea, vomiting, diarrhea in the setting      of chronic antihypertensive use.  However, he is at high risk for      adrenal insufficiency, given his chronic prednisone use and sepsis      given the rest of his clinical picture.  We are going to bolus him      with intravenous fluids.  If this does not respond, we will need to      put a central line in and begin sepsis protocol in the intensive      care unit.  3. Fever.  Given his clinical history and exposure is most likely      secondary to possible influenza triggering asthma exacerbation the      rest of the clinical picture.  Chest x-ray was initially negative      for a pneumonia.  However, he appears to be dehydrated on exam.  We      are going to start antibiotics for possible community-acquired      pneumonia, given the clinical picture.  Also going to check blood      cultures, sputum cultures, influenza swab, urine for legionella      antigen and strep antigen.  4. Nausea, vomiting, diarrhea.  Again, I think this is most likely      secondary to his acute illness and pneumonia versus influenza.  We      will proceed as above.  5. Hypokalemia.  Most likely secondary to nausea, vomiting, diarrhea,      in addition to albuterol use.  We are going to try and replete  orally.  If he is unable to tolerate, we will have to try to     replete intravenously and monitor.  6. Episode of dark stools.  This is  concerning.  However, he does not      have any history of alcohol use, NSAID use, or other risk factors      for gastrointestinal bleeding.  We will try to hemoccult him once      the continuous      nebulizers have stopped and he is a little bit more stable from a      respiratory status.  We are not going to use Lovenox at this time      given the one episode of dark stools.  Instead we will use PSA hose      until we rule out possible gastrointestinal bleed, although again I      think this is unlikely.      Manning Charity, MD     KK/MEDQ  D:  01/18/2007  T:  01/18/2007  Job:  [id number]   cc:   Merlene Laughter. Renae Gloss, M.D.

## 2011-04-13 NOTE — Discharge Summary (Signed)
   NAME:  [name], SAVILLE                           ACCOUNT NO.:  [id number]   MEDICAL RECORD NO.:  [id number]                   PATIENT TYPE:  INP   LOCATION:  5703                                 FACILITY:  [facility]   PHYSICIAN:  Oley Balm. Sung Amabile, M.D. [facility]          DATE OF BIRTH:  [birth year]   DATE OF ADMISSION:  03/23/2003  DATE OF DISCHARGE:  03/25/2003                                 DISCHARGE SUMMARY   ADMISSION DIAGNOSIS:  Acute asthma exacerbation.   DISCHARGE DIAGNOSIS:  Acute asthma exacerbation.   HISTORY OF PRESENT ILLNESS:  The patient is a 45 year old gentleman with  severe persistent asthma.  He has had four life-threatening exacerbations in  the past requiring intubation.  He was last seen in the office in March.  He  states he was compliant with his medications.  He was admitted on March 23, 2003, by Dr. Jayme Cloud with symptoms of increasing respiratory distress.  He  was found to have diffuse inspiratory and expiratory wheezes on initial  examination.   HOSPITAL COURSE:  He was treated in the usual fashion with nebulized  bronchodilators, corticosteroids, and low-flow oxygen.  By the second day of  hospitalization he was markedly improved.  He was transferred to the regular  floor, and by the third day of hospitalization he was ready for discharge  home, almost back to baseline.  His discharge examination reveals only  minimal scattered wheezes.  His room air oxygen saturation is 94%.   DISCHARGE MEDICATIONS:  1. Advair 500/50 1 inhalation twice a day.  2. Albuterol metered dose inhaler p.r.n.  3. Flonase nasal spray 2 sprays per nostril daily.  4. Singulair 10 mg b.i.d.  5. Zantac 150 mg b.i.d.   FOLLOW-UP:  He already has a scheduled follow-up in May of this year.  I  have contacted my office and instructed them to work on making arrangements  for Xolair therapy to be initiated as soon as possible.  Of note, we have  discussed Xolair in the past, and he has been  reluctant to undertake this  therapy but is willing to do so now based after this current  hospitalization.                                               Oley Balm Sung Amabile, M.D. [facility]    DBS/MEDQ  D:  03/25/2003  T:  03/26/2003  Job:  [id number]

## 2011-04-13 NOTE — Discharge Summary (Signed)
NAME:  [name], STGERMAINE                           ACCOUNT NO.:  [id number]   MEDICAL RECORD NO.:  [id number]                   PATIENT TYPE:  INP   LOCATION:  0356                                 FACILITY:  [facility]   PHYSICIAN:  Oley Balm. Sung Amabile, M.D. [facility]          DATE OF BIRTH:  1966/03/10   DATE OF ADMISSION:  12/15/2002  DATE OF DISCHARGE:  12/17/2002                                 DISCHARGE SUMMARY   DISCHARGE DIAGNOSES:  1. Acute exacerbation of asthma.  2. Acute sinusitis.   HISTORY OF PRESENT ILLNESS:  The patient is a 45 year old [place] American  male, a patient of Onalee Hua B. Simonds, M.D., who has questionable steroid-  dependent asthma for months with a peak expiratory flow of greater than 600  on December 08, 2002, now increasing dyspnea and requiring daily prednisone.  He has been seen in the ER and was admitted for further evaluation and  treatment after proving refractory to treatment with magnesium sulfate and  the usual pharmaceutical interventions.   LABORATORY DATA:  WBC is 16.8, hemoglobin is 15.6, hematocrit is 45.9,  platelets are 232.  Sodium 142, potassium 3.4, chloride is 106, CO2 is 22,  __________ is 94, BUN is 11, creatinine 1.2, AST is 40.  IgG serum 649.   RADIOGRAPHIC DATA:  Limited CT of the paranasal sinuses showed:  1.  Worsening diffuse mucosa thickening involving the maxillary sinuses.  2.  Decreased mucosal thickening involving the frontal sinuses.  3.  Near  complete opacification of ethmoid sinuses without interval changes.  The  chest x-ray showed no active disease.   HOSPITAL COURSE:  #1 - ACUTE EXACERBATION OF ASTHMA:  The patient was  treated with the usual pharmaceutical regimen with IV steroids and IV  antibiotics along with nebulized bronchodilators.  He reached maximum  hospital benefit on December 17, 2002, and was ready for discharge home.   #2 - SINUSITIS:  Sinusitis was noted by the CT scan.  This was most likely  the cause of his  current exacerbation of asthma and the reason he has proven  refractory to current treatment.  He has been treated with nasal hygiene  consisting of Afrin two puffs b.i.d. followed by Flonase and/or Nasonex one  puff b.i.d. and then salt water two puffs four times a day to complete nasal  hygiene.  Afrin is to be continued for a complete course of five days and  then be discontinued.  He will also be treated with Tequin for a total of 10  days of antimicrobial therapy.  He had an ENT evaluation by Kinnie Scales.  Annalee Genta, M.D., while in the hospital and will follow up with Veverly Fells.  Arletha Grippe, M.D., for further evaluation and treatment with a repeat CT scan in  the near future.   DISCHARGE MEDICATIONS:  1. Advair 550 mg one puff b.i.d.  2. Singulair 10 mg daily.  3. Albuterol 2.5  mg nebulizers p.r.n. as needed.  4. Nasonex one puff two times a day.  5. Salt water two puffs four times a day.  6. Afrin two puffs two times a day for a total of five days.  7. Tequin 400 mg one a day until complete.  8. Prednisone on taper at 4 mg for four days, 3 mg for four days, 2 mg a day     and then stay.    FOLLOW-UP:  A follow-up appointment has already been scheduled with Onalee Hua B.  Sung Amabile, M.D., towards the end of February.  He will follow up with Veverly Fells. Arletha Grippe, M.D., at the appointed time.   DISPOSITION:  He is being discharged home in improved condition.     Brett Canales Minor, A.C.N.P. [facility]                 Oley Balm. Sung Amabile, M.D. [facility]    SM/MEDQ  D:  12/17/2002  T:  12/17/2002  Job:  [id number]   cc:   Veverly Fells. Arletha Grippe, M.D.  [address]  [place]  [place] [address]  Fax: [phone]

## 2011-04-13 NOTE — H&P (Signed)
NAME:  [name], [name]                           ACCOUNT NO.:  [id number]   MEDICAL RECORD NO.:  [id number]                   PATIENT TYPE:  INP   LOCATION:  2105                                 FACILITY:  [facility]   PHYSICIAN:  Danice Goltz, M.D. [facility]            DATE OF BIRTH:  03-18-1966   DATE OF ADMISSION:  01/08/2003  DATE OF DISCHARGE:                                HISTORY & PHYSICAL   PRIMARY CARE PHYSICIAN:  1. Olene Craven, M.D., [facility]  2. __________   CHIEF COMPLAINT:  Asthma attack.   HISTORY OF PRESENT ILLNESS:  The patient is a 45 year old African American  male with a history of severe asthma and multiple intubations.  The patient  was doing well earlier today.  He developed a headache while at work and  took a Goody's Headache Powder.  He had last taken one of these long ago  before his presentation of asthma.  Within approximately 20 minutes, he  developed severe shortness of breath and wheezing.  He was at work, and they  called 911 for transport to the ED.   PAST MEDICAL HISTORY:  1. Sinus problems.  2. Asthma since 1995, with many admissions and four intubations, the last     approximately 1-1/2 years ago.  Asthma is questionably triggered by a     previous job.  3. PFTs in October 2000, showing a FEV1 2.51, FVC 4.13, ratio 60.77, PEF     3.62, showing moderate obstruction, low vital capacity questionably from     concomitant restrictive defect.   ALLERGIES:  No known drug allergies, except possibly ASPIRIN.   MEDICATIONS:  1. Singulair 10 mg p.o. b.i.d.  2. The patient had been off prednisone after a taper, ending approximately     two days ago.  3. Advair 5/500 one puff b.i.d.  4. Zantac 150 mg p.o. b.i.d.  5. Albuterol MDI.  6. Flovent MDI.  7. Flonase two puffs per nostril daily.   SOCIAL HISTORY:  The patient has been married for eight years.  Has two  children, ages 18 and 7-1/2 years old.  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  No smoking, although he does have a remote 5-pack-  year history.  Denies alcohol or drug use.   FAMILY HISTORY:  Father is deceased at age 57 from CVA, hypertension, and  alcoholism.  Mother is alive at 51 with hypercholesterolemia and  hypertension.  No family history of asthma or lung disease.   PHYSICAL EXAMINATION:  VITAL SIGNS:  Afebrile, pulse 112-124, respirations  initially 48 and now 24, blood pressure initially 216/74 and now 119/47,  initial saturation 85% and now saturation of 97% on BiPAP.  GENERAL:  The patient is tripod sitting and on BiPAP but now less anxious  and able to converse minimally.  HEENT:  PERRLA.  EOMI.  CARDIOVASCULAR:  Regular  rhythm with tachycardia.  LUNGS:  Expiratory greater than inspiratory wheezes bilaterally with  increased expiratory time.  ABDOMEN:  Soft and nontender.  EXTREMITIES:  Lower extremities with no clubbing, cyanosis, edema, or calf  tenderness.  NEUROLOGICAL:  Grossly intact.   LABORATORY DATA:  ABGs 7.397, 45, 53 before BiPAP.   Chest x-ray shows air trapping.   ASSESSMENT:  The patient is a 45 year old African American male with  significant __________ symptoms who presents with status asthmaticus.   PLAN:  1. Status asthmaticus:  The patient received epinephrine, Decadron,     Benadryl, Xopenex, magnesium sulfate 4 g bolus and 200 mL/hr drip in the     ED.  Also on BiPAP, continue on BiPAP.  Repeat ABGs.  Change to Solu-     Medrol DAW.  Continue Xopenex nebulizers.  No Atrovent.  Home medications     in a.m.  Anticipate overnight improvement in status with possible weaning     from the BiPAP in the morning versus this afternoon if he does extremely     well.  2. Asthma:  Per Dr. Jayme Cloud, the patient may benefit from Xolair, which is     an anti-IgE compound which is given each week as an outpatient.  Other     consideration is chronic steroids, but the risk here is great.  The      patient has been on steroids for most of the last nine years and should     already have bone densitometry to assess for osteoporosis.  If he     continues on steroids as an outpatient, he will likely need calcium,     vitamin D, and Actonel each week as well as bone densitometry at least     through five years.  Avoidance of triggers is vitally important including     pollutants, allergens, smoke, etc.  The patient should also monitor peak     flows as they will show variation before he becomes symptomatic.  The     patient is to be observed closely at the ICU level secondary to high risk     for intubation secondary to status asthmaticus.  An IgE previously during     an admission was elevated at 689.  We will check a RAST today.  He does     not appear to have an acute infectious process at this time.     Georgana Curio, M.D.                   Danice Goltz, M.D. [facility]    JEY/MEDQ  D:  01/08/2003  T:  01/08/2003  Job:  [id number]   cc:   Olene Craven, M.D.  [address]  [address]  [place]  [place] [address]  Fax: [phone]

## 2011-04-13 NOTE — H&P (Signed)
NAME:  [name], [name]                           ACCOUNT NO.:  [id number]   MEDICAL RECORD NO.:  [id number]                   PATIENT TYPE:  EMS   LOCATION:  MAJO                                 FACILITY:  [facility]   PHYSICIAN:  Jackie Plum, M.D.             DATE OF BIRTH:  14-Jan-1966   DATE OF ADMISSION:  09/30/2003  DATE OF DISCHARGE:                                HISTORY & PHYSICAL   PROBLEM LIST:  1. Acute bronchial asthma exacerbation.  2. Leukocytosis and hyperglycemia likely secondary to steroid use.  3. Hypokalemia with admitting potassium of 3.3 likely secondary to albuterol     treatment in the emergency department.   This is for 23-hour observation.   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  He  has history of bronchial asthma status post endotracheal intubation x5, last  intubation was in August 2004, steroid dependence currently taking  prednisone 10 mg p.o. daily.  Shortness of breath was very severe at time of  presentation to the ED.  However, had improved remarkably during my  evaluation.  He gives history of a cough productive of yellowish sputum  without any fever or chills.  He does not have any pleuritic chest pain.  Does not have any nausea, vomiting, abdominal pain, dysuria, or frequency of  micturition.  The patient did well until a few days ago when he started  having symptoms of upper respiratory infection with chest congestion and  rhinorrhea.  He had just moved from his old home to a new home and believed  that the trigger this time may not be related to dust.  In the ED, the  patient was noted to be tachycardic and tachypneic with the respiratory rate  being around 30 per minute.  He received albuterol nebulization and IV Solu-  Medrol with significant improvement.  Chest x-ray was done by the ED  physician and it is negative for any acute  infiltrate.  The patient was  planned for discharge.  However, he was noted to be desaturation __________  activity.  In view of his history of frequent intubations and his propensity  for desaturation, the ED physician felt it was prudent for the patient to be  admitted at least for 23-hour observation prior to discharge.   PAST MEDICAL HISTORY:  Notable for bronchial asthma with several intubations  as mentioned above.  No other past medical history.  No family history of  heart disease.   MEDICATIONS:  Include albuterol nebulizer and prednisone 10 mg p.o. daily.   He does not have any medication allergies.   FAMILY HISTORY:  Negative for heart disease.   He does not smoke cigarettes nor drink alcohol.   REVIEW OF SYSTEMS:  Unremarkable on systemic inquiry except as in the HPI  above.   PHYSICAL  EXAMINATION:  VITAL SIGNS:  BP 120/85, pulse rate of 84 (had  dropped from 112 per minute), respiratory rate was 22 (from 30 per minute).  Temperature was 97.7 degrees Fahrenheit.  O2 saturation was 97% on 2 liters  of oxygen by nasal cannula.  GENERAL:  He was markedly acute cardiopulmonary distress.  He was not toxic-  looking.  HEENT:  Normocephalic, atraumatic.  Pupils were equally round, reactive to  light.  Extraocular muscles were intact.  No conjunctival icterus.  No  pallor.  Oropharynx was moist without any erythema or exudates.  TMs were  unremarkable.  NECK:  Supple.  No JVD.  LUNGS:  Notable for vesicular breath sounds which was slightly decreased  with expiratory wheezes.  No crackles were appreciated.  CARDIAC:  Regular rate and rhythm without any gallops or murmur.  ABDOMEN:  Full, nontender.  Bowel sounds were present.  They were  normoactive.  EXTREMITIES:  Negative for any edema.  CENTRAL NERVOUS SYSTEM:  He was alert and oriented x3.  No focal deficits.   LABORATORY DATA:  Chest x-ray as noted above.  WBC count of 14.8, hemoglobin  14.7, hematocrit 43.6, MCV  86.8, platelet count 209,  Sodium 142, potassium  3.3, chloride 109, CO2 27, glucose 129, BUN 10, creatinine 1, calcium 8.1.   ASSESSMENT:  Acute asthma exacerbation in a patient with history of  bronchial asthma status post recurrent endotracheal intubation x5.  Last  episode in August 2004.  Steroid-dependent.  The patient has significantly  improved with nebulizers and steroid treatment in the emergency department.  However, he has a tendency to desaturate with activity.  I think it is  appropriate to admit this patient for 23-hour observation.  Will give him  Zithromax p.o. and continue the nebulizers as well as intravenous Solu-  Medrol for now.                                                Jackie Plum, M.D.    GO/MEDQ  D:  09/30/2003  T:  09/30/2003  Job:  [id number]   cc:   Corinna L. Lendell Caprice, MD  Fax: [phone]   Merlene Laughter. Renae Gloss, M.D.  [address]  [address]  [place]  [place] [address]  Fax: [phone]

## 2011-04-13 NOTE — Consult Note (Signed)
[facility]  Patient:    [name]                         MRN: [id number] Proc. Date: 11/01/99 Adm. Date:  [id number] Attending:  Olene Craven                          Consultation Report  REQUESTING PHYSICIAN:  Duke Salvia. Ether Griffins, M.D.  HISTORY:  This is a very pleasant 45 year old African-American gentleman who presents with increasing shortness of breath, wheezing, and coughing.  He is known to be steroid-dependent asthmatic who has also been intubated and ventilated twice in the past, once approximately five years ago and the other time approximately a year ago.  On presentation to the emergency room a few hours ago, he was given intravenous Solu-Medrol 125 mg IV and also nebulizers on a continual basis. This improved him to a large degree and he feels much improved symptomatically.  The  ______ agonist seems to have made him more anxious than usual.  PAST MEDICAL HISTORY:  No operations.  Asthma as mentioned above.  CURRENT MEDICATIONS: 1. Singulair 10 mg q.d. 2. Serevent inhaler 2 puffs b.i.d. 3. Pulmicort inhaler. 4. Albuterol nebulizers as required. 5. Prednisone 10 mg q.d.  ALLERGIES:  No known drug allergies.  SOCIAL HISTORY:  He is a married gentleman who lives with his wife.  He quit smoking approximately one year ago.  He does not abuse alcohol.  He works in administration.  FAMILY HISTORY:  Noncontributory.  PHYSICAL EXAMINATION:  VITAL SIGNS:  Afebrile.  Pulse oximetry on room air 97% now.  Blood pressure 120/80, pulse 18.  ______ sinus rhythm.  CARDIOVASCULAR:  Heart sounds are present and normal with no murmurs or added sounds.  JVP is not raised.  There is no peripheral pitting edema.  RESPIRATORY:  Air entry is equal in both lung fields and fair.  There is no use of accessory muscles with respiration.  Percussion note is resonant.  Breath sounds are vesicular.  There are no crackles.  There is  bilateral scattered wheezing.   ABDOMEN:  Soft, nontender with no hepatosplenomegaly.  NEUROLOGIC:  He is alert and oriented with no focal neurological signs.  INVESTIGATIONS:  Chest x-ray shows a hyperinflated chest consistent with asthma or COPD.  No lab work currently available.  IMPRESSION AND PLAN:  Moderate to severe asthmatic attack.  We will admit him for observation and put him on intravenous steroids.  The next few hours will be crucial to make sure he does not deteriorate once again, as he is fragile as evidenced by being steroid dependent and history of intubation and ventilation n the past x 2.  Further recommendations will follow according to progress. DD:  11/01/99 TD:  11/01/99 Job: [id number]

## 2011-04-13 NOTE — Discharge Summary (Signed)
   NAME:  [name], [name]                           ACCOUNT NO.:  [id number]   MEDICAL RECORD NO.:  [id number]                   PATIENT TYPE:  INP   LOCATION:  4735                                 FACILITY:  [facility]   PHYSICIAN:  Corinna L. Lendell Caprice, MD             DATE OF BIRTH:  May 01, 1966   DATE OF ADMISSION:  09/30/2003  DATE OF DISCHARGE:  10/01/2003                                 DISCHARGE SUMMARY   DIAGNOSIS:  Acute bronchitis with exacerbation of asthma.   DISCHARGE MEDICATIONS:  1. Azithromycin 250 mg p.o. daily for 3 more days.  2. Prednisone taper.  3. Albuterol hand-held nebulizer or MDI q.4h. p.r.n.   FOLLOW UP:  Follow up with Dr. Renae Gloss in two weeks.   DIET:  Regular.   ACTIVITY:  Ad lib.   CONDITION ON DISCHARGE:  Stable.   HISTORY AND HOSPITAL COURSE:  [name] is a very pleasant 45 year old black  male with a history of asthma requiring several intubations and multiple  hospitalizations in the past who presented with a cough productive of clear  to yellow sputum and wheezing.  He was tachypneic, hypoxic, and wheezing in  the emergency room despite having multiple hand-held nebulizer treatments  and steroids.  The patient  has been on prednisone for the last nine years.  He used to be on Singulair, Advair, Flonase, multiple other medications, but  he has stopped this.  He was put on observation status, given hand-held  nebulizers, IV Solu-Medrol, and the following day, his oxygen saturations  were normal.   At the time of discharge, he had no wheezing, was ambulating, had normal  vital signs, and feeling at his baseline.  I recommended starting patient  back on Singulair and maintenance inhalers, but he was resistant to this and  would like to discuss it with his primary care physician.                                                Corinna L. Lendell Caprice, MD    CLS/MEDQ  D:  10/01/2003  T:  10/02/2003  Job:  [id number]   cc:   Merlene Laughter. Renae Gloss, M.D.  [address]  [address]  [place]  [place] [address]  Fax: [phone]

## 2011-04-26 ENCOUNTER — Other Ambulatory Visit: Payer: Self-pay | Admitting: Pulmonary Disease

## 2011-05-10 ENCOUNTER — Telehealth: Payer: Self-pay | Admitting: Pulmonary Disease

## 2011-05-10 NOTE — Telephone Encounter (Signed)
Patient phoned returning a call to triage he can be reached at [phone].[name]

## 2011-05-10 NOTE — Telephone Encounter (Signed)
[id number]

## 2011-05-10 NOTE — Telephone Encounter (Signed)
Pt called back.  Informed him no samples avail and to check with his pcp.  Pt verbalized understanding and stated he will call his pcp.

## 2011-05-10 NOTE — Telephone Encounter (Signed)
[id number].  I don't see anywhere in pt's chart in epic or centricity where RA prescribed this for pt.  He needs to ask his pcp, Dr. Andi Devon, for samples and/or rx.

## 2011-05-11 ENCOUNTER — Ambulatory Visit (INDEPENDENT_AMBULATORY_CARE_PROVIDER_SITE_OTHER): Payer: BC Managed Care – PPO

## 2011-05-11 ENCOUNTER — Telehealth: Payer: Self-pay | Admitting: *Deleted

## 2011-05-11 DIAGNOSIS — J45909 Unspecified asthma, uncomplicated: Secondary | ICD-10-CM

## 2011-05-11 NOTE — Telephone Encounter (Signed)
Received refill request for Prednisone 10mg . Sent in approval and received error message. Called the [facility] on [address] and was advised pt received refill from Dr. Andi Devon 04/25/11 Prednisone 20mg  #30 x 2 from [facility] on [address]

## 2011-05-15 MED ORDER — OMALIZUMAB 150 MG ~~LOC~~ SOLR
300.0000 mg | Freq: Once | SUBCUTANEOUS | Status: AC
  Administered 2011-05-15: 300 mg via SUBCUTANEOUS

## 2011-05-29 ENCOUNTER — Other Ambulatory Visit: Payer: Self-pay | Admitting: Pulmonary Disease

## 2011-06-05 NOTE — Telephone Encounter (Signed)
Pt needs follow up for additional refills 

## 2011-06-11 ENCOUNTER — Ambulatory Visit: Payer: BC Managed Care – PPO

## 2011-06-13 ENCOUNTER — Ambulatory Visit (INDEPENDENT_AMBULATORY_CARE_PROVIDER_SITE_OTHER): Payer: BC Managed Care – PPO

## 2011-06-13 DIAGNOSIS — J45909 Unspecified asthma, uncomplicated: Secondary | ICD-10-CM

## 2011-06-13 MED ORDER — OMALIZUMAB 150 MG ~~LOC~~ SOLR
300.0000 mg | Freq: Once | SUBCUTANEOUS | Status: AC
  Administered 2011-06-13: 300 mg via SUBCUTANEOUS

## 2011-06-13 MED ORDER — OMALIZUMAB 150 MG ~~LOC~~ SOLR
225.0000 mg | Freq: Once | SUBCUTANEOUS | Status: AC
  Administered 2011-06-13: 225 mg via SUBCUTANEOUS

## 2011-06-13 NOTE — Progress Notes (Signed)
Addended by: Ludwig Clarks on: 06/13/2011 04:57 PM   Modules accepted: Orders

## 2011-06-13 NOTE — Progress Notes (Signed)
Entered in error corrected charges and dosage

## 2011-07-03 ENCOUNTER — Ambulatory Visit (INDEPENDENT_AMBULATORY_CARE_PROVIDER_SITE_OTHER): Payer: BC Managed Care – PPO | Admitting: Pulmonary Disease

## 2011-07-03 ENCOUNTER — Encounter: Payer: Self-pay | Admitting: Pulmonary Disease

## 2011-07-03 DIAGNOSIS — J329 Chronic sinusitis, unspecified: Secondary | ICD-10-CM

## 2011-07-03 DIAGNOSIS — J45909 Unspecified asthma, uncomplicated: Secondary | ICD-10-CM

## 2011-07-03 MED ORDER — PREDNISONE 5 MG PO TABS: 5.0000 mg | ORAL_TABLET | Freq: Every day | ORAL | Status: AC

## 2011-07-03 MED ORDER — MONTELUKAST SODIUM 10 MG PO TABS: 10.0000 mg | ORAL_TABLET | Freq: Every day | ORAL | Status: DC

## 2011-07-03 NOTE — Patient Instructions (Signed)
I think [name] is working Decrease prednisone - AUG -5mg  on weekends & 10 mg weekdays SEP - 10 mg M/W/F , 5 mg other days OCT - 5mg  daily  STay on symbicort 2 puffs twice daily Get back on singulair

## 2011-07-03 NOTE — Progress Notes (Signed)
  Subjective:    Patient ID: [name], male    DOB: [birth year], 45 y.o.   MRN: [id number]  HPI 45 yo [place] American male for FU of severe persistent asthma difficult to control in past requiring multiple intubations.  Asthma Onset age 57 when he worked in a [employer], ETT x 6, last in 2/10, NSAID ( Advil) incuded, Allergy skin test neg, s/p nasal surgery for polyps & deviated septum, baseline prednisone 10 alternating with 5 mg, BPF > 600  CT sinuses 3/10 - widespread sinus inflammatory disease ? polyp  Admitted 12/18-12/22/11  for asthma exacerbation and sinusitis. CT sinus showed bilateral sinusitis.  Feb '12 >> RAST reviewed IgE 183, started on XOLAIR 3/12  Skin test- Positives for grass, weed and tree. 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 No nocturnal symptoms, compliant with symbicort, singulair has dropped off his list    Review of Systems Patient denies significant dyspnea,cough, hemoptysis,  chest pain, palpitations, pedal edema, orthopnea, paroxysmal nocturnal dyspnea, lightheadedness, nausea, vomiting, abdominal or  leg pains      Objective:   Physical Exam Gen. Pleasant, well-nourished, in no distress ENT - no lesions, no post nasal drip Neck: No JVD, no thyromegaly, no carotid bruits Lungs: no use of accessory muscles, no dullness to percussion, clear without rales or rhonchi  Cardiovascular: Rhythm regular, heart sounds  normal, no murmurs or gallops, no peripheral edema Musculoskeletal: No deformities, no cyanosis or clubbing         Assessment & Plan:

## 2011-07-05 NOTE — Assessment & Plan Note (Signed)
Decrease prednisone - AUG -5mg  on weekends & 10 mg weekdays SEP - 10 mg M/W/F , 5 mg other days OCT - 5mg  daily  STay on symbicort 2 puffs twice daily Get back on singulair

## 2011-07-11 ENCOUNTER — Ambulatory Visit: Payer: BC Managed Care – PPO

## 2011-08-07 ENCOUNTER — Ambulatory Visit (INDEPENDENT_AMBULATORY_CARE_PROVIDER_SITE_OTHER): Payer: BC Managed Care – PPO

## 2011-08-07 DIAGNOSIS — J45909 Unspecified asthma, uncomplicated: Secondary | ICD-10-CM

## 2011-08-08 DIAGNOSIS — J45909 Unspecified asthma, uncomplicated: Secondary | ICD-10-CM

## 2011-08-08 MED ORDER — OMALIZUMAB 150 MG ~~LOC~~ SOLR
300.0000 mg | Freq: Once | SUBCUTANEOUS | Status: AC
  Administered 2011-08-08: 300 mg via SUBCUTANEOUS

## 2011-09-06 ENCOUNTER — Ambulatory Visit: Payer: BC Managed Care – PPO

## 2011-09-13 ENCOUNTER — Ambulatory Visit (INDEPENDENT_AMBULATORY_CARE_PROVIDER_SITE_OTHER): Payer: BC Managed Care – PPO

## 2011-09-13 DIAGNOSIS — J45909 Unspecified asthma, uncomplicated: Secondary | ICD-10-CM

## 2011-09-14 DIAGNOSIS — J45909 Unspecified asthma, uncomplicated: Secondary | ICD-10-CM

## 2011-09-14 MED ORDER — OMALIZUMAB 150 MG ~~LOC~~ SOLR
300.0000 mg | Freq: Once | SUBCUTANEOUS | Status: AC
  Administered 2011-09-14: 300 mg via SUBCUTANEOUS

## 2011-10-01 ENCOUNTER — Telehealth: Payer: Self-pay | Admitting: Pulmonary Disease

## 2011-10-01 NOTE — Telephone Encounter (Signed)
ATC pt.  Family member answered the phone and stated he wasn't avail.  WCB.  Need to verify pred dose because when he was last seen by RA in August...directions were: Decrease prednisone - AUG -5mg  on weekends & 10 mg weekdays SEP - 10 mg M/W/F , 5 mg other days  OCT - 5mg   So unsure if he is still supposed to be on Pred in NOV?!?!?!??!?!

## 2011-10-02 NOTE — Telephone Encounter (Signed)
ATC pt to clarify prednisone rx. Per last OV note on 07/03/11 with Dr. [name], the patient was to take prednisone 5 mg once a day during the month of October. Need to verify that this is the dosage the patient is needing.

## 2011-10-03 MED ORDER — PREDNISONE 10 MG PO TABS: ORAL_TABLET | ORAL | Status: DC

## 2011-10-03 NOTE — Telephone Encounter (Signed)
Still unable to reach pt via phone. RX sent to pt's pharmacy for refill on prednisone.

## 2011-10-15 ENCOUNTER — Inpatient Hospital Stay (HOSPITAL_COMMUNITY)
Admission: EM | Admit: 2011-10-15 | Discharge: 2011-10-18 | DRG: 097 | Disposition: A | Discharge status: Home / Self Care | Payer: BC Managed Care – PPO | Attending: Internal Medicine | Admitting: Internal Medicine

## 2011-10-15 ENCOUNTER — Other Ambulatory Visit: Payer: Self-pay

## 2011-10-15 ENCOUNTER — Ambulatory Visit: Payer: BC Managed Care – PPO

## 2011-10-15 ENCOUNTER — Emergency Department (HOSPITAL_COMMUNITY): Payer: BC Managed Care – PPO

## 2011-10-15 ENCOUNTER — Encounter (HOSPITAL_COMMUNITY): Payer: Self-pay | Admitting: Emergency Medicine

## 2011-10-15 DIAGNOSIS — J45909 Unspecified asthma, uncomplicated: Secondary | ICD-10-CM | POA: Diagnosis present

## 2011-10-15 DIAGNOSIS — J45901 Unspecified asthma with (acute) exacerbation: Principal | ICD-10-CM | POA: Diagnosis present

## 2011-10-15 DIAGNOSIS — R0602 Shortness of breath: Secondary | ICD-10-CM | POA: Diagnosis present

## 2011-10-15 DIAGNOSIS — J069 Acute upper respiratory infection, unspecified: Secondary | ICD-10-CM | POA: Diagnosis present

## 2011-10-15 DIAGNOSIS — R079 Chest pain, unspecified: Secondary | ICD-10-CM | POA: Diagnosis present

## 2011-10-15 DIAGNOSIS — J329 Chronic sinusitis, unspecified: Secondary | ICD-10-CM | POA: Diagnosis present

## 2011-10-15 DIAGNOSIS — I10 Essential (primary) hypertension: Secondary | ICD-10-CM | POA: Diagnosis present

## 2011-10-15 DIAGNOSIS — J9600 Acute respiratory failure, unspecified whether with hypoxia or hypercapnia: Secondary | ICD-10-CM | POA: Insufficient documentation

## 2011-10-15 DIAGNOSIS — J310 Chronic rhinitis: Secondary | ICD-10-CM | POA: Diagnosis present

## 2011-10-15 HISTORY — DX: Shortness of breath: R06.02

## 2011-10-15 LAB — CBC
HCT: 42.3 % (ref 39.0–52.0)
Hemoglobin: 13.8 g/dL (ref 13.0–17.0)
MCV: 86.9 fL (ref 78.0–100.0)
RDW: 12.5 % (ref 11.5–15.5)
WBC: 10.5 10*3/uL (ref 4.0–10.5)

## 2011-10-15 LAB — DIFFERENTIAL
Basophils Absolute: 0 10*3/uL (ref 0.0–0.1)
Eosinophils Relative: 1 % (ref 0–5)
Lymphocytes Relative: 16 % (ref 12–46)
Monocytes Absolute: 1.3 10*3/uL — ABNORMAL HIGH (ref 0.1–1.0)
Monocytes Relative: 13 % — ABNORMAL HIGH (ref 3–12)
Neutro Abs: 7.4 10*3/uL (ref 1.7–7.7)

## 2011-10-15 LAB — BASIC METABOLIC PANEL
BUN: 10 mg/dL (ref 6–23)
CO2: 29 mEq/L (ref 19–32)
Calcium: 8.9 mg/dL (ref 8.4–10.5)
Chloride: 101 mEq/L (ref 96–112)
Creatinine, Ser: 1.25 mg/dL (ref 0.50–1.35)
Glucose, Bld: 88 mg/dL (ref 70–99)

## 2011-10-15 MED ORDER — ALBUTEROL SULFATE (5 MG/ML) 0.5% IN NEBU
5.0000 mg | INHALATION_SOLUTION | Freq: Once | RESPIRATORY_TRACT | Status: AC
  Administered 2011-10-15: 5 mg via RESPIRATORY_TRACT

## 2011-10-15 MED ORDER — IPRATROPIUM BROMIDE 0.02 % IN SOLN
RESPIRATORY_TRACT | Status: AC
  Filled 2011-10-15: qty 2.5

## 2011-10-15 MED ORDER — ALBUTEROL SULFATE (5 MG/ML) 0.5% IN NEBU
5.0000 mg | INHALATION_SOLUTION | Freq: Once | RESPIRATORY_TRACT | Status: AC
  Administered 2011-10-15: 5 mg via RESPIRATORY_TRACT
  Filled 2011-10-15: qty 1

## 2011-10-15 MED ORDER — POTASSIUM CHLORIDE CRYS ER 20 MEQ PO TBCR
40.0000 meq | EXTENDED_RELEASE_TABLET | Freq: Once | ORAL | Status: AC
  Administered 2011-10-15: 40 meq via ORAL
  Filled 2011-10-15: qty 2

## 2011-10-15 MED ORDER — ALBUTEROL SULFATE (5 MG/ML) 0.5% IN NEBU
5.0000 mg | INHALATION_SOLUTION | Freq: Once | RESPIRATORY_TRACT | Status: AC
  Administered 2011-10-15: 5 mg via RESPIRATORY_TRACT
  Filled 2011-10-15: qty 0.5

## 2011-10-15 MED ORDER — HYDROCODONE-ACETAMINOPHEN 5-325 MG PO TABS
2.0000 | ORAL_TABLET | Freq: Once | ORAL | Status: AC
  Administered 2011-10-15: 2 via ORAL
  Filled 2011-10-15: qty 2

## 2011-10-15 MED ORDER — IPRATROPIUM BROMIDE 0.02 % IN SOLN
0.5000 mg | Freq: Once | RESPIRATORY_TRACT | Status: AC
  Administered 2011-10-15: 0.5 mg via RESPIRATORY_TRACT

## 2011-10-15 MED ORDER — ALBUTEROL SULFATE (5 MG/ML) 0.5% IN NEBU
INHALATION_SOLUTION | RESPIRATORY_TRACT | Status: AC
  Filled 2011-10-15: qty 1

## 2011-10-15 MED ORDER — ALBUTEROL SULFATE (5 MG/ML) 0.5% IN NEBU
INHALATION_SOLUTION | RESPIRATORY_TRACT | Status: AC
  Filled 2011-10-15: qty 2

## 2011-10-15 MED ORDER — SODIUM CHLORIDE 0.9 % IV SOLN: INTRAVENOUS | Status: DC

## 2011-10-15 MED ORDER — METHYLPREDNISOLONE SODIUM SUCC 125 MG IJ SOLR
125.0000 mg | Freq: Once | INTRAMUSCULAR | Status: AC
  Administered 2011-10-15: 125 mg via INTRAVENOUS
  Filled 2011-10-15: qty 2

## 2011-10-15 MED ORDER — IPRATROPIUM BROMIDE 0.02 % IN SOLN
RESPIRATORY_TRACT | Status: AC
  Administered 2011-10-15: 0.5 mg
  Filled 2011-10-15: qty 5

## 2011-10-15 MED ORDER — IPRATROPIUM BROMIDE 0.02 % IN SOLN
0.5000 mg | Freq: Once | RESPIRATORY_TRACT | Status: AC
  Administered 2011-10-15: 0.5 mg via RESPIRATORY_TRACT
  Filled 2011-10-15 (×2): qty 2.5

## 2011-10-15 NOTE — ED Notes (Signed)
WUJ:[id number]<YN> Expected date:10/15/11<BR> Expected time: 3:24 PM<BR> Means of arrival:Ambulance<BR> Comments:<BR> EMS 32 GC - asthma

## 2011-10-15 NOTE — ED Notes (Signed)
Albuterol/atrovent medications given to ems for replacement

## 2011-10-15 NOTE — ED Notes (Signed)
Pt presenting to ed with c/o respiratory distress onset x 3-4 days. Per ems pt with productive cough with greenish colored sputum. Per ems pt has had increased shortness of breath with positive wheezing in bilateral lower lobes. Per ems pt received albuterol 5mg  and atrovent prior to arrival . Per ems pt states chest discomfort and soreness that's associated with his cough.

## 2011-10-15 NOTE — ED Notes (Signed)
Patient transported to X-ray 

## 2011-10-15 NOTE — H&P (Signed)
[name] is an 46 y.o. male.   Chief Complaint: Shortness of breath  HPI: This patient is a pleasant 45 year old asthmatic male with severe asthma who has had multiple hospitalizations for asthma exacerbation.  He is compliant but his disease is very bad and he has uncontrollable exacerbations at times.  He reports that over this past weekend 2-3 days prior to admission he is experiencing increasing shortness of breath and purulent sputum production.  The patient says he has had yellow and green sputum production.  He has had postnasal sinus drainage and nasal congestion.  He also suffers from chronic sinusitis and has had on sinus surgery in the past.  The patient reports that he is concerned that his medicines have not broken the cycle of wheezing and shortness of breath and he came to the hospital because of his history of having to be intubated 7 times in the past for asthma.  The patient is taking Xolair injections and had been working with his pulmonologist to wean the steroids down.  He is currently taking 10 mg daily of prednisone.  He reports that this is the lowest dose that he's been able to take since being on prednisone since 1995.  Hospital admission was requested for aggressive treatment of this shortness of breath and asthma exacerbation.  Past Medical History  Diagnosis Date  . Unspecified sinusitis (chronic)   . Unspecified essential hypertension   . Nonspecific low blood pressure reading   . Acute respiratory failure   . Unspecified asthma        Patient Has Been Ventilated 7 Times with Asthma exacerbations  Past Surgical History  Procedure Date  . Vasectomy   . Knee arthroscopy      left x2  . Nasal polyp surgery 2003  . Nasal septum surgery 1998    Family History  Problem Relation Age of Onset  . Allergies Sister   . Stroke Mother   . Stroke Father   . Hypertension Mother   . Alzheimer's disease Maternal Uncle    Social History:  reports that he quit smoking  about 12 years ago. His smoking use included Cigarettes. He has never used smokeless tobacco. He reports that he drinks alcohol. He reports that he does not use illicit drugs.  Allergies:  Allergies  Allergen Reactions  . Aspirin     REACTION: asthma flare  . Nsaids     REACTION: worsens asthma    Medications Prior to Admission  Medication Sig Dispense Refill  . albuterol (PROVENTIL) (2.5 MG/3ML) 0.083% nebulizer solution Take 2.5 mg by nebulization every 4 (four) hours as needed. For shortness of breath.      Marland Kitchen albuterol (VENTOLIN HFA) 108 (90 BASE) MCG/ACT inhaler Inhale 2 puffs into the lungs every 4 (four) hours as needed. For shortness of breath.      . budesonide-formoterol (SYMBICORT) 160-4.5 MCG/ACT inhaler Inhale 2 puffs into the lungs 2 (two) times daily.        . cetirizine (ZYRTEC) 10 MG tablet Take 10 mg by mouth at bedtime as needed. For allergies.      . montelukast (SINGULAIR) 10 MG tablet Take 10 mg by mouth daily.        . Multiple Vitamin (MULTIVITAMIN) tablet Take 1 tablet by mouth daily.        Marland Kitchen olmesartan-hydrochlorothiazide (BENICAR HCT) 20-12.5 MG per tablet Take 1 tablet by mouth 2 (two) times daily.        . predniSONE (DELTASONE) 10 MG  tablet Take 10 mg by mouth daily.        . ranitidine (ZANTAC) 150 MG capsule Take 150 mg by mouth daily.         Results for orders placed during the hospital encounter of 10/15/11 (from the past 48 hour(s))  CBC     Status: Normal   Collection Time   10/15/11  3:58 PM      Component Value Range Comment   WBC 10.5  4.0 - 10.5 (K/uL)    RBC 4.87  4.22 - 5.81 (MIL/uL)    Hemoglobin 13.8  13.0 - 17.0 (g/dL)    HCT 16.1  09.6 - 04.5 (%)    MCV 86.9  78.0 - 100.0 (fL)    MCH 28.3  26.0 - 34.0 (pg)    MCHC 32.6  30.0 - 36.0 (g/dL)    RDW 40.9  81.1 - 91.4 (%)    Platelets 236  150 - 400 (K/uL)   DIFFERENTIAL     Status: Abnormal   Collection Time   10/15/11  3:58 PM      Component Value Range Comment   Neutrophils  Relative 70  43 - 77 (%)    Neutro Abs 7.4  1.7 - 7.7 (K/uL)    Lymphocytes Relative 16  12 - 46 (%)    Lymphs Abs 1.6  0.7 - 4.0 (K/uL)    Monocytes Relative 13 (*) 3 - 12 (%)    Monocytes Absolute 1.3 (*) 0.1 - 1.0 (K/uL)    Eosinophils Relative 1  0 - 5 (%)    Eosinophils Absolute 0.1  0.0 - 0.7 (K/uL)    Basophils Relative 0  0 - 1 (%)    Basophils Absolute 0.0  0.0 - 0.1 (K/uL)   BASIC METABOLIC PANEL     Status: Abnormal   Collection Time   10/15/11  3:58 PM      Component Value Range Comment   Sodium 139  135 - 145 (mEq/L)    Potassium 3.1 (*) 3.5 - 5.1 (mEq/L)    Chloride 101  96 - 112 (mEq/L)    CO2 29  19 - 32 (mEq/L)    Glucose, Bld 88  70 - 99 (mg/dL)    BUN 10  6 - 23 (mg/dL)    Creatinine, Ser 7.82  0.50 - 1.35 (mg/dL)    Calcium 8.9  8.4 - 10.5 (mg/dL)    GFR calc non Af Amer 70 (*) >90 (mL/min)    GFR calc Af Amer 81 (*) >90 (mL/min)    Dg Chest 2 View  10/15/2011  *RADIOLOGY REPORT*  Clinical Data: Short of breath and cough  CHEST - 2 VIEW  Comparison: 02/03/2011  Findings: Heart is normal in size.  Bronchitic changes are stable. No consolidation or mass.  No pneumothorax.  No pleural effusion. Calcified granulomata in the right lower lobe are  IMPRESSION: No active cardiopulmonary disease.  Original Report Authenticated By: Donavan Burnet, M.D.    Review of Systems  Constitutional: Negative for fever, chills, weight loss, malaise/fatigue and diaphoresis.  HENT: Positive for congestion and sore throat. Negative for hearing loss, ear pain, nosebleeds, tinnitus and ear discharge.   Eyes: Negative for blurred vision, double vision, pain and redness.  Respiratory: Positive for cough, sputum production, shortness of breath and wheezing. Negative for hemoptysis.   Cardiovascular: Negative for chest pain, palpitations, orthopnea, claudication and leg swelling.  Gastrointestinal: Negative for heartburn, nausea, vomiting, abdominal pain, diarrhea, constipation and blood  in  stool.  Musculoskeletal: Negative.  Negative for myalgias.  Skin: Negative for itching and rash.  Neurological: Negative for dizziness, tingling, tremors, sensory change, weakness and headaches.  Endo/Heme/Allergies: Positive for environmental allergies. Negative for polydipsia. Does not bruise/bleed easily.  Psychiatric/Behavioral: Negative.     Blood pressure 125/44, pulse 99, temperature 99.8 F (37.7 C), temperature source Oral, resp. rate 18, SpO2 95.00%. Physical Exam  Constitutional: He is oriented to person, place, and time. He appears well-nourished. No distress.  HENT:  Head: Normocephalic.  Mouth/Throat: Oropharynx is clear and moist. No oropharyngeal exudate.       Postnasal drainage Swollen nasal turbinates bilateral   Neck: No JVD present. No tracheal deviation present. No thyromegaly present.  Respiratory: Stridor present. No respiratory distress. He has wheezes. He exhibits tenderness.       Shallow breath sounds Insp/exp wheezes heard bilateral and diffuse   GI: Soft. Bowel sounds are normal. He exhibits no distension and no mass. There is no tenderness. There is no rebound and no guarding.  Musculoskeletal: Normal range of motion. He exhibits no edema and no tenderness.  Neurological: He is alert and oriented to person, place, and time. He has normal reflexes. No cranial nerve deficit. Coordination normal.  Skin: Skin is warm and dry. No rash noted. He is not diaphoretic. No erythema. No pallor.  Psychiatric: He has a normal mood and affect. His behavior is normal. Judgment and thought content normal.     Assessment/Plan  1.  Asthma exacerbation with history of acute respiratory failure requiring ventilation 2.  Severe recalcitrant asthma 3.  Chronic sinusitis 4.  Hypertension 5.  Upper respiratory infection  Plan The patient was evaluated in the emergency department and is already starting to experience improvement in symptoms.  Because of his history of  respiratory failure, subsequent ventilation and severity of symptoms he is going to be admitted to the hospital to the step down unit to be monitored closely.  We will continue IV Solu-Medrol and frequent nebulizer treatments.  I want to start an IV antibiotic Avelox 400 mg IV daily.  If he tolerates this he continues to improve then after that he can be transferred to the floor but at this point I'm not comfortable sending him directly to the floor given that he has some stridor and his wheezing significantly although he is clinically having improvement.  The patient also reports that he was scheduled to have a Xolair injection today.  He missed that because he's been here in the emergency department.  I will ask the attending physician to consider consult and the patient's pulmonologist as well.  Please see orders.     Standley Dakins M.D. 10/15/2011, 9:23 PM

## 2011-10-16 ENCOUNTER — Encounter (HOSPITAL_COMMUNITY): Payer: Self-pay | Admitting: Family Medicine

## 2011-10-16 LAB — BASIC METABOLIC PANEL
CO2: 23 mEq/L (ref 19–32)
Chloride: 103 mEq/L (ref 96–112)
GFR calc Af Amer: 87 mL/min — ABNORMAL LOW (ref >90)
Potassium: 3.6 mEq/L (ref 3.5–5.1)
Sodium: 137 mEq/L (ref 135–145)

## 2011-10-16 LAB — CBC
HCT: 38.2 % — ABNORMAL LOW (ref 39.0–52.0)
HCT: 41.2 % (ref 39.0–52.0)
Hemoglobin: 12.9 g/dL — ABNORMAL LOW (ref 13.0–17.0)
Hemoglobin: 13.5 g/dL (ref 13.0–17.0)
MCV: 85.3 fL (ref 78.0–100.0)
MCV: 86.4 fL (ref 78.0–100.0)
RBC: 4.48 MIL/uL (ref 4.22–5.81)
RBC: 4.77 MIL/uL (ref 4.22–5.81)
RDW: 12.8 % (ref 11.5–15.5)
WBC: 7.5 10*3/uL (ref 4.0–10.5)
WBC: 7.5 10*3/uL (ref 4.0–10.5)

## 2011-10-16 LAB — MRSA PCR SCREENING: MRSA by PCR: NEGATIVE

## 2011-10-16 LAB — CREATININE, SERUM
GFR calc Af Amer: 87 mL/min — ABNORMAL LOW (ref >90)
GFR calc non Af Amer: 75 mL/min — ABNORMAL LOW (ref >90)

## 2011-10-16 MED ORDER — TRAZODONE 25 MG HALF TABLET
25.0000 mg | ORAL_TABLET | Freq: Every evening | ORAL | Status: DC | PRN
  Filled 2011-10-16: qty 1

## 2011-10-16 MED ORDER — GUAIFENESIN-DM 100-10 MG/5ML PO SYRP
5.0000 mL | ORAL_SOLUTION | ORAL | Status: DC | PRN
  Administered 2011-10-16 (×2): 5 mL via ORAL
  Administered 2011-10-17: 10 mL via ORAL
  Filled 2011-10-16: qty 5
  Filled 2011-10-16: qty 10
  Filled 2011-10-16: qty 5

## 2011-10-16 MED ORDER — INFLUENZA VIRUS VACC SPLIT PF IM SUSP
0.5000 mL | INTRAMUSCULAR | Status: AC
  Administered 2011-10-17: 0.5 mL via INTRAMUSCULAR
  Filled 2011-10-16: qty 0.5

## 2011-10-16 MED ORDER — ALBUTEROL SULFATE (5 MG/ML) 0.5% IN NEBU
2.5000 mg | INHALATION_SOLUTION | RESPIRATORY_TRACT | Status: DC
  Administered 2011-10-16 – 2011-10-17 (×10): 2.5 mg via RESPIRATORY_TRACT
  Filled 2011-10-16 (×9): qty 0.5

## 2011-10-16 MED ORDER — LORATADINE 10 MG PO TABS
10.0000 mg | ORAL_TABLET | Freq: Every day | ORAL | Status: DC
  Administered 2011-10-16 – 2011-10-18 (×3): 10 mg via ORAL
  Filled 2011-10-16 (×4): qty 1

## 2011-10-16 MED ORDER — SODIUM CHLORIDE 0.9 % IV SOLN
250.0000 mL | INTRAVENOUS | Status: DC
  Administered 2011-10-16: 06:00:00 via INTRAVENOUS

## 2011-10-16 MED ORDER — MOXIFLOXACIN HCL IN NACL 400 MG/250ML IV SOLN
400.0000 mg | INTRAVENOUS | Status: DC
  Administered 2011-10-16 – 2011-10-17 (×2): 400 mg via INTRAVENOUS
  Filled 2011-10-16 (×2): qty 250

## 2011-10-16 MED ORDER — BUDESONIDE-FORMOTEROL FUMARATE 160-4.5 MCG/ACT IN AERO
2.0000 | INHALATION_SPRAY | Freq: Two times a day (BID) | RESPIRATORY_TRACT | Status: DC
  Administered 2011-10-16 – 2011-10-18 (×6): 2 via RESPIRATORY_TRACT
  Filled 2011-10-16: qty 6

## 2011-10-16 MED ORDER — OLMESARTAN MEDOXOMIL-HCTZ 20-12.5 MG PO TABS: 1.0000 | ORAL_TABLET | Freq: Two times a day (BID) | ORAL | Status: DC

## 2011-10-16 MED ORDER — ACETAMINOPHEN 650 MG RE SUPP: 650.0000 mg | Freq: Four times a day (QID) | RECTAL | Status: DC | PRN

## 2011-10-16 MED ORDER — THERA M PLUS PO TABS
1.0000 | ORAL_TABLET | Freq: Every day | ORAL | Status: DC
  Administered 2011-10-16 – 2011-10-18 (×3): 1 via ORAL
  Filled 2011-10-16 (×5): qty 1

## 2011-10-16 MED ORDER — PANTOPRAZOLE SODIUM 40 MG PO TBEC
40.0000 mg | DELAYED_RELEASE_TABLET | Freq: Every day | ORAL | Status: DC
  Administered 2011-10-16 – 2011-10-18 (×3): 40 mg via ORAL
  Filled 2011-10-16 (×3): qty 1

## 2011-10-16 MED ORDER — PNEUMOCOCCAL VAC POLYVALENT 25 MCG/0.5ML IJ INJ
0.5000 mL | INJECTION | INTRAMUSCULAR | Status: AC
  Administered 2011-10-17: 0.5 mL via INTRAMUSCULAR
  Filled 2011-10-16: qty 0.5

## 2011-10-16 MED ORDER — ONDANSETRON HCL 4 MG PO TABS: 4.0000 mg | ORAL_TABLET | Freq: Four times a day (QID) | ORAL | Status: DC | PRN

## 2011-10-16 MED ORDER — MONTELUKAST SODIUM 10 MG PO TABS
10.0000 mg | ORAL_TABLET | Freq: Every day | ORAL | Status: DC
  Administered 2011-10-16 – 2011-10-18 (×3): 10 mg via ORAL
  Filled 2011-10-16 (×4): qty 1

## 2011-10-16 MED ORDER — ONDANSETRON HCL 4 MG/2ML IJ SOLN: 4.0000 mg | Freq: Four times a day (QID) | INTRAMUSCULAR | Status: DC | PRN

## 2011-10-16 MED ORDER — ALBUTEROL SULFATE HFA 108 (90 BASE) MCG/ACT IN AERS: 2.0000 | INHALATION_SPRAY | RESPIRATORY_TRACT | Status: DC | PRN

## 2011-10-16 MED ORDER — SODIUM CHLORIDE 0.9 % IJ SOLN
3.0000 mL | Freq: Two times a day (BID) | INTRAMUSCULAR | Status: DC
  Administered 2011-10-17 – 2011-10-18 (×2): 3 mL via INTRAVENOUS

## 2011-10-16 MED ORDER — OLMESARTAN MEDOXOMIL 20 MG PO TABS
20.0000 mg | ORAL_TABLET | Freq: Two times a day (BID) | ORAL | Status: DC
  Administered 2011-10-16 – 2011-10-18 (×5): 20 mg via ORAL
  Filled 2011-10-16 (×7): qty 1

## 2011-10-16 MED ORDER — METHYLPREDNISOLONE SODIUM SUCC 125 MG IJ SOLR
80.0000 mg | Freq: Four times a day (QID) | INTRAMUSCULAR | Status: DC
  Administered 2011-10-16: 80 mg via INTRAVENOUS
  Filled 2011-10-16: qty 2

## 2011-10-16 MED ORDER — HYDROCODONE-ACETAMINOPHEN 5-325 MG PO TABS
1.0000 | ORAL_TABLET | Freq: Four times a day (QID) | ORAL | Status: DC | PRN
  Administered 2011-10-16 – 2011-10-17 (×3): 1 via ORAL
  Filled 2011-10-16 (×2): qty 1

## 2011-10-16 MED ORDER — ENOXAPARIN SODIUM 40 MG/0.4ML ~~LOC~~ SOLN
40.0000 mg | SUBCUTANEOUS | Status: DC
  Administered 2011-10-16 – 2011-10-18 (×3): 40 mg via SUBCUTANEOUS
  Filled 2011-10-16 (×4): qty 0.4

## 2011-10-16 MED ORDER — ACETAMINOPHEN 325 MG PO TABS
650.0000 mg | ORAL_TABLET | Freq: Four times a day (QID) | ORAL | Status: DC | PRN
  Administered 2011-10-16: 650 mg via ORAL
  Filled 2011-10-16: qty 2

## 2011-10-16 MED ORDER — PREDNISONE 50 MG PO TABS
50.0000 mg | ORAL_TABLET | Freq: Every day | ORAL | Status: DC
  Administered 2011-10-16 – 2011-10-18 (×3): 50 mg via ORAL
  Filled 2011-10-16 (×3): qty 1

## 2011-10-16 MED ORDER — SODIUM CHLORIDE 0.9 % IJ SOLN
3.0000 mL | INTRAMUSCULAR | Status: DC | PRN
  Administered 2011-10-16: 10 mL via INTRAVENOUS

## 2011-10-16 MED ORDER — HYDROCHLOROTHIAZIDE 12.5 MG PO CAPS
12.5000 mg | ORAL_CAPSULE | Freq: Two times a day (BID) | ORAL | Status: DC
  Administered 2011-10-16 – 2011-10-18 (×5): 12.5 mg via ORAL
  Filled 2011-10-16 (×7): qty 1

## 2011-10-16 MED ORDER — IPRATROPIUM BROMIDE 0.02 % IN SOLN
0.5000 mg | RESPIRATORY_TRACT | Status: DC
  Administered 2011-10-16 – 2011-10-17 (×8): 0.5 mg via RESPIRATORY_TRACT
  Filled 2011-10-16 (×9): qty 2.5

## 2011-10-16 NOTE — ED Notes (Signed)
Orders for morning cbc and bmp entered in an unclear way, per charge rn ( jennifer) informed to have the am labs drawn with labels at hand and to inform the floor to have the wed am labs reordered.

## 2011-10-16 NOTE — Progress Notes (Signed)
[facility] progress note     Subjective: Feels a lot better-states that he really wishes to go home and feels theat when he coughs, he gets "tight" has had quite a few breathing Rx's.  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  Usually when he gets sick, this is what happens as well States that he has had a couple of sick kids at school-had chills when he first presented to the ED   Objective: Vital signs in last 24 hours: Temp:  [98.1 F (36.7 C)-99.8 F (37.7 C)] 98.1 F (36.7 C) (11/20 0930) Pulse Rate:  [78-116] 78  (11/20 0800) Resp:  [9-25] 17  (11/20 0930) BP: (125-178)/(44-88) 137/70 mmHg (11/20 0930) SpO2:  [92 %-97 %] 93 % (11/20 0930) Weight change:   Intake/Output Summary (Last 24 hours) at 10/16/11 0943 Last data filed at 10/16/11 0350  Gross per 24 hour  Intake      0 ml  Output    400 ml  Net   -400 ml    BP 137/70  Pulse 78  Temp(Src) 98.1 F (36.7 C) (Oral)  Resp 17  SpO2 93% Head: Normocephalic, without obvious abnormality, atraumatic Eyes: conjunctivae/corneas clear. PERRL, EOM's intact. Fundi benign. Throat: lips, mucosa, and tongue normal; teeth and gums normal Lungs: diminished breath sounds bilaterally Heart: regular rate and rhythm, S1, S2 normal, no murmur, click, rub or gallop Abdomen: soft, non-tender; bowel sounds normal; no masses,  no organomegaly Extremities: extremities normal, atraumatic, no cyanosis or edema Skin: Skin color, texture, turgor normal. No rashes or lesions or tattoo RUE  Lab Results:  Basename 10/16/11 0600 10/15/11 1558  NA -- 139  K -- 3.1*  CL -- 101  CO2 -- 29  GLUCOSE -- 88  BUN -- 10  CREATININE 1.17 1.25  CALCIUM -- 8.9  MG -- --  PHOS -- --   No results found for this basename: AST:2,ALT:2,ALKPHOS:2,BILITOT:2,PROT:2,ALBUMIN:2 in the last 72 hours No results found for this basename: LIPASE:2,AMYLASE:2 in the last 72 hours  Basename 10/16/11 0600 10/15/11 1558    WBC 7.5 10.5  NEUTROABS -- 7.4  HGB 12.9* 13.8  HCT 38.2* 42.3  MCV 85.3 86.9  PLT 206 236   Micro Results: No results found for this or any previous visit (from the past 240 hour(s)).  Medications: I have reviewed the patient's current medications. Prior to Admission:  (Not in a hospital admission) Scheduled Meds:   . albuterol  2.5 mg Nebulization Q3H  . albuterol  5 mg Nebulization Once  . albuterol  5 mg Nebulization Once  . albuterol  5 mg Nebulization Once  . albuterol  5 mg Nebulization Once  . budesonide-formoterol  2 puff Inhalation BID  . enoxaparin  40 mg Subcutaneous Q24H  . hydrochlorothiazide  12.5 mg Oral BID  . HYDROcodone-acetaminophen  2 tablet Oral Once  . ipratropium      . ipratropium  0.5 mg Nebulization Once  . ipratropium  0.5 mg Nebulization Once  . ipratropium  0.5 mg Nebulization Q3H  . loratadine  10 mg Oral Daily  . methylPREDNISolone (SOLU-MEDROL) injection  125 mg Intravenous Once  . methylPREDNISolone (SOLU-MEDROL) injection  80 mg Intravenous Q6H  . montelukast  10 mg Oral Daily  . moxifloxacin  400 mg Intravenous Q24H  . multivitamins ther. w/minerals  1 tablet Oral Daily  . olmesartan  20 mg Oral BID  . pantoprazole  40 mg Oral Q0600  . potassium  chloride  40 mEq Oral Once  . sodium chloride  3 mL Intravenous Q12H  . DISCONTD: sodium chloride   Intravenous STAT  . DISCONTD: olmesartan-hydrochlorothiazide  1 tablet Oral BID   Continuous Infusions:   . sodium chloride 1 mL/hr at 10/16/11 0556   PRN Meds:.acetaminophen, acetaminophen, albuterol, HYDROcodone-acetaminophen, ondansetron (ZOFRAN) IV, ondansetron, sodium chloride, traZODone   Assessment/Plan: Patient Active Hospital Problem List: Asthma exacerbation (10/15/2011)   Assessment:Calcified granulomata in the right lower lobe on CX-Follows with [facility] for Allergy shots of Xolara-Missed apt here due to being sick Had testing 2/12 for Allergens-Pollen and grass appear  to be the culprits  HYPERTENSION (01/18/2009)   Assessment:  SINUSITIS, CHRONIC (01/18/2009)   Assessment: Continue allergen modification benefit from Repeat RAST Immunotherapy per Dr. Gwenith Spitz Pulmonologist ASTHMA, PERSISTENT, SEVERE (06/03/2008)   Assessment: Clearly not needing intubation at this time-stabilizing well, and will transition to Oral steroids for 6 days more.  Close follow up c Pulmo/PCP RHINITIS (01/15/2011)  See above SOB (shortness of breath) (10/15/2011)   Assessment:    Plan: Ambulate-document O2 sat-rest as above   LOS: 1 day   [facility] 10/16/2011, 9:43 AM

## 2011-10-16 NOTE — ED Notes (Signed)
Gave patient heart healthy food per md admission orders.  Patient stable resting in room at this time.  Patient maintaining with 2L [place] at this time and not requiring additional breathing treatments.

## 2011-10-16 NOTE — ED Notes (Signed)
Patient coughing and having difficulty breathing. Notified respiratory and released admission orders for albuterol and atrovent.  Respiratory on way to give patient breathing treatment.  Patient sounds tight with insp exp wheezing.

## 2011-10-16 NOTE — ED Notes (Signed)
Report given to TCU.  

## 2011-10-16 NOTE — ED Notes (Signed)
Patient running a temp and states has a headache..  Have him a norco and a tylenol

## 2011-10-16 NOTE — ED Provider Notes (Signed)
History     CSN: [id number] Arrival date & time: 10/15/2011  3:35 PM   First MD Initiated Contact with Patient 10/15/11 1536      Chief Complaint  Patient presents with  . Asthma    (Consider location/radiation/quality/duration/timing/severity/associated sxs/prior treatment) HPI Patient is a 45 year old male with a history of asthma and 7 prior intubations for this. He presented by EMS after developing shortness of breath today. He has been coughing and having cough productive of green sputum for the plastic 4 days. He denies fever. Patient has not been hospitalized for 2 years. He is on prednisone 10 mg by mouth daily chronically. He did not have any chest pain at this time. He is short of breath despite albuterol treatment in route. He denies any other symptoms. Past Medical History  Diagnosis Date  . Unspecified sinusitis (chronic)   . Unspecified essential hypertension   . Nonspecific low blood pressure reading   . Acute respiratory failure   . Unspecified asthma     Past Surgical History  Procedure Date  . Vasectomy   . Knee arthroscopy      left x2  . Nasal polyp surgery 2003  . Nasal septum surgery 1998    Family History  Problem Relation Age of Onset  . Allergies Sister   . Stroke Mother   . Stroke Father   . Hypertension Mother   . Alzheimer's disease Maternal Uncle     History  Substance Use Topics  . Smoking status: Former Smoker    Types: Cigarettes    Quit date: 11/26/1998  . Smokeless tobacco: Never Used  . Alcohol Use: Yes     socially      Review of Systems  Constitutional: Negative.   HENT: Negative.   Eyes: Negative.   Respiratory: Positive for cough and shortness of breath.   Cardiovascular: Negative.   Gastrointestinal: Negative.   Genitourinary: Negative.   Musculoskeletal: Negative.   Skin: Negative.   Neurological: Negative.   Hematological: Negative.   Psychiatric/Behavioral: Negative.   All other systems reviewed and are  negative.    Allergies  Aspirin and Nsaids  Home Medications   Current Outpatient Rx  Name Route Sig Dispense Refill  . ALBUTEROL SULFATE (2.5 MG/3ML) 0.083% IN NEBU Nebulization Take 2.5 mg by nebulization every 4 (four) hours as needed. For shortness of breath.    . ALBUTEROL SULFATE HFA 108 (90 BASE) MCG/ACT IN AERS Inhalation Inhale 2 puffs into the lungs every 4 (four) hours as needed. For shortness of breath.    . BUDESONIDE-FORMOTEROL FUMARATE 160-4.5 MCG/ACT IN AERO Inhalation Inhale 2 puffs into the lungs 2 (two) times daily.      Marland Kitchen CETIRIZINE HCL 10 MG PO TABS Oral Take 10 mg by mouth at bedtime as needed. For allergies.    Marland Kitchen MONTELUKAST SODIUM 10 MG PO TABS Oral Take 10 mg by mouth daily.      Marland Kitchen ONE-DAILY MULTI VITAMINS PO TABS Oral Take 1 tablet by mouth daily.      Marland Kitchen OLMESARTAN MEDOXOMIL-HCTZ 20-12.5 MG PO TABS Oral Take 1 tablet by mouth 2 (two) times daily.      Marland Kitchen PREDNISONE 10 MG PO TABS Oral Take 10 mg by mouth daily.      Marland Kitchen RANITIDINE HCL 150 MG PO CAPS Oral Take 150 mg by mouth daily.       BP 153/75  Pulse 93  Temp(Src) 99.5 F (37.5 C) (Oral)  Resp 17  SpO2 94%  Physical  Exam  Nursing note and vitals reviewed. Constitutional: He is oriented to person, place, and time. He appears well-developed and well-nourished. No distress.  HENT:  Head: Normocephalic and atraumatic.  Eyes: Conjunctivae and EOM are normal. Pupils are equal, round, and reactive to light.  Neck: Normal range of motion.  Cardiovascular: Normal rate, regular rhythm, normal heart sounds and intact distal pulses.  Exam reveals no gallop and no friction rub.   No murmur heard. Pulmonary/Chest: Tachypnea noted. He has decreased breath sounds.  Abdominal: Soft. Bowel sounds are normal. He exhibits no distension. There is no tenderness. There is no rebound and no guarding.  Musculoskeletal: Normal range of motion. He exhibits no edema.  Neurological: He is alert and oriented to person, place, and  time. No cranial nerve deficit. He exhibits normal muscle tone. Coordination normal.  Skin: Skin is warm and dry. No rash noted.  Psychiatric: He has a normal mood and affect.    ED Course  Procedures (including critical care time)  Labs Reviewed  DIFFERENTIAL - Abnormal; Notable for the following:    Monocytes Relative 13 (*)    Monocytes Absolute 1.3 (*)    All other components within normal limits  BASIC METABOLIC PANEL - Abnormal; Notable for the following:    Potassium 3.1 (*)    GFR calc non Af Amer 70 (*)    GFR calc Af Amer 81 (*)    All other components within normal limits  CBC   Dg Chest 2 View  10/15/2011  *RADIOLOGY REPORT*  Clinical Data: Short of breath and cough  CHEST - 2 VIEW  Comparison: 02/03/2011  Findings: Heart is normal in size.  Bronchitic changes are stable. No consolidation or mass.  No pneumothorax.  No pleural effusion. Calcified granulomata in the right lower lobe are  IMPRESSION: No active cardiopulmonary disease.  Original Report Authenticated By: Donavan Burnet, M.D.     1. Acute respiratory failure       MDM  Patient was evaluated and was given a dose of Solu-Medrol 125 mg IV. He had chest x-ray with no acute infiltrate. CBC and renal panel were within normal limits. Patient was given Atrovent and albuterol nebs x2. With this he had improvement. Peak flow was 450. Patient reports his normal value was 750. After 1 hour patient had decline in his status. He had peak flow of only 250 at that time. Patient was restarted on albuterol nebs as well as Atrovent. With this patient stabilized once again. Given patient's history he will be admitted to medicine for further management. Patient was accepted by [facility].        Cyndra Numbers, MD 10/16/11 [phone]

## 2011-10-17 MED ORDER — ALBUTEROL SULFATE (5 MG/ML) 0.5% IN NEBU
2.5000 mg | INHALATION_SOLUTION | Freq: Four times a day (QID) | RESPIRATORY_TRACT | Status: DC
  Administered 2011-10-17 – 2011-10-18 (×3): 2.5 mg via RESPIRATORY_TRACT
  Filled 2011-10-17 (×4): qty 0.5

## 2011-10-17 MED ORDER — GUAIFENESIN ER 600 MG PO TB12
600.0000 mg | ORAL_TABLET | Freq: Two times a day (BID) | ORAL | Status: DC
  Administered 2011-10-17 – 2011-10-18 (×3): 600 mg via ORAL
  Filled 2011-10-17 (×5): qty 1

## 2011-10-17 MED ORDER — ALBUTEROL SULFATE (5 MG/ML) 0.5% IN NEBU: 2.5000 mg | INHALATION_SOLUTION | RESPIRATORY_TRACT | Status: DC | PRN

## 2011-10-17 MED ORDER — AMLODIPINE BESYLATE 5 MG PO TABS
5.0000 mg | ORAL_TABLET | Freq: Every day | ORAL | Status: DC
  Administered 2011-10-17 – 2011-10-18 (×2): 5 mg via ORAL
  Filled 2011-10-17 (×3): qty 1

## 2011-10-17 MED ORDER — MOXIFLOXACIN HCL 400 MG PO TABS
400.0000 mg | ORAL_TABLET | Freq: Every day | ORAL | Status: DC
  Administered 2011-10-18: 400 mg via ORAL
  Filled 2011-10-17: qty 1

## 2011-10-17 MED ORDER — IPRATROPIUM BROMIDE 0.02 % IN SOLN
0.5000 mg | Freq: Four times a day (QID) | RESPIRATORY_TRACT | Status: DC
  Administered 2011-10-17 – 2011-10-18 (×3): 0.5 mg via RESPIRATORY_TRACT
  Filled 2011-10-17 (×4): qty 2.5

## 2011-10-17 NOTE — Progress Notes (Signed)
Subjective: Feels better. C/O chest pain with cough, but no shortness of breath. No new issues.  Objective: Vital signs in last 24 hours: Temp:  [97.5 F (36.4 C)-99 F (37.2 C)] 97.6 F (36.4 C) (11/21 0800) Pulse Rate:  [58-84] 65  (11/21 0800) Resp:  [14-20] 16  (11/21 0800) BP: (140-168)/(77-108) 168/108 mmHg (11/21 0800) SpO2:  [94 %-97 %] 94 % (11/21 0927) Weight:  [83.1 kg (183 lb 3.2 oz)] 183 lb 3.2 oz (83.1 kg) (11/21 0000) Weight change:  Last BM Date: 10/14/11  Intake/Output from previous day: 11/20 0701 - 11/21 0700 In: 610 [P.O.:360; IV Piggyback:250] Out: 2800 [Urine:2800] Total I/O In: -  Out: 425 [Urine:425]   Physical Exam:  General: Comfortable, alert, communicative, fully oriented, not short of breath at rest.  HEENT:  No clinical pallor, no jaundice, no conjunctival injection or discharge. NECK:  Supple, JVP not seen, no carotid bruits, no palpable lymphadenopathy, no palpable goiter. CHEST:  Clinically clear to auscultation, no wheezes, no crackles. HEART:  Sounds 1 and 2 heard, normal, regular, no murmurs. ABDOMEN:  Full, soft, no scars, non-tender, no palpable organomegaly, no palpable masses, normal bowel sounds. LOWER EXTREMITIES:  No pitting edema, palpable peripheral pulses. MUSCULOSKELETAL SYSTEM:  Generalized osteoarthritic changes, otherwise, normal. CENTRAL NERVOUS SYSTEM:  No focal neurologic deficit on gross examination.  Lab Results:  Basename 10/16/11 1004 10/16/11 0600  WBC 7.5 7.5  HGB 13.5 12.9*  HCT 41.2 38.2*  PLT 214 206    Basename 10/16/11 1004 10/16/11 0600 10/15/11 1558  NA 137 -- 139  K 3.6 -- 3.1*  CL 103 -- 101  CO2 23 -- 29  GLUCOSE 126* -- 88  BUN 16 -- 10  CREATININE 1.17 1.17 --  CALCIUM 9.0 -- 8.9   Recent Results (from the past 240 hour(s))  MRSA PCR SCREENING     Status: Normal   Collection Time   10/16/11 10:17 PM      Component Value Range Status Comment   MRSA by PCR NEGATIVE  NEGATIVE  Final        Studies/Results: Dg Chest 2 View  10/15/2011  *RADIOLOGY REPORT*  Clinical Data: Short of breath and cough  CHEST - 2 VIEW  Comparison: 02/03/2011  Findings: Heart is normal in size.  Bronchitic changes are stable. No consolidation or mass.  No pneumothorax.  No pleural effusion. Calcified granulomata in the right lower lobe are  IMPRESSION: No active cardiopulmonary disease.  Original Report Authenticated By: Donavan Burnet, M.D.    Medications: Scheduled Meds:   . albuterol  2.5 mg Nebulization Q3H  . budesonide-formoterol  2 puff Inhalation BID  . enoxaparin  40 mg Subcutaneous Q24H  . hydrochlorothiazide  12.5 mg Oral BID  . influenza  inactive virus vaccine  0.5 mL Intramuscular Tomorrow-1000  . ipratropium  0.5 mg Nebulization Q3H  . loratadine  10 mg Oral Daily  . montelukast  10 mg Oral Daily  . moxifloxacin  400 mg Intravenous Q24H  . multivitamins ther. w/minerals  1 tablet Oral Daily  . olmesartan  20 mg Oral BID  . pantoprazole  40 mg Oral Q0600  . pneumococcal 23 valent vaccine  0.5 mL Intramuscular Tomorrow-1000  . predniSONE  50 mg Oral QAC breakfast  . sodium chloride  3 mL Intravenous Q12H   Continuous Infusions:   . sodium chloride 1 mL/hr at 10/16/11 0556   PRN Meds:.acetaminophen, acetaminophen, albuterol, guaiFENesin-dextromethorphan, HYDROcodone-acetaminophen, ondansetron, sodium chloride, traZODone  Assessment/Plan:  Principal Problem:  *  Asthma exacerbation: This is infective, secondary to acute bronchitis. Patient is on Avelox day#3. Will continue. Also, nebs, O2, Steroids. Active Problems:  1. HYPERTENSION: Uncontrolled. No previous hx HTN. Add Norvasc to Rx.  SINUSITIS, CHRONIC: Stable.  ASTHMA, PERSISTENT, SEVERE: As above. Not SOB today, No  Wheeze on auscultation.  RHINITIS  SOB (shortness of breath)  Comment: Transfer to Med-Surg. Aim discharge on 11/222/12., with Pulmonary F/U.   LOS: 2 days   Oree Hislop,CHRISTOPHER 10/17/2011, 11:02  AM

## 2011-10-17 NOTE — Progress Notes (Signed)
PHARMACIST - PHYSICIAN COMMUNICATION DR:   [facility] CONCERNING: Antibiotic IV to Oral Route Change Policy  RECOMMENDATION: This patient is receiving Avelox by the intravenous route.  Based on criteria approved by the Pharmacy and Therapeutics Committee, the antibiotic(s) is/are being converted to the equivalent oral dose form(s).   DESCRIPTION: These criteria include:  Patient being treated for a respiratory tract infection, urinary tract infection, or cellulitis  The patient is not neutropenic and does not exhibit a GI malabsorption state  The patient is eating (either orally or via tube) and/or has been taking other orally administered medications for a least 24 hours  The patient is improving clinically and has a Tmax < 100.5  If you have questions about this conversion, please contact the Pharmacy Department  []  ( [phone] )  [facility] []  ( [phone] )  [place]  []  ( [phone] )  [facility] [x]  ( [phone] )  [facility]     

## 2011-10-17 NOTE — Progress Notes (Signed)
Report rec'd from RN Delisa Cobb @ 1530.

## 2011-10-18 LAB — BASIC METABOLIC PANEL
BUN: 17 mg/dL (ref 6–23)
Calcium: 9.2 mg/dL (ref 8.4–10.5)
Creatinine, Ser: 1.27 mg/dL (ref 0.50–1.35)
GFR calc Af Amer: 79 mL/min — ABNORMAL LOW (ref >90)
GFR calc non Af Amer: 68 mL/min — ABNORMAL LOW (ref >90)
Glucose, Bld: 108 mg/dL — ABNORMAL HIGH (ref 70–99)
Potassium: 3 mEq/L — ABNORMAL LOW (ref 3.5–5.1)

## 2011-10-18 LAB — CBC
HCT: 43.7 % (ref 39.0–52.0)
MCH: 28.6 pg (ref 26.0–34.0)
MCHC: 33.4 g/dL (ref 30.0–36.0)
RDW: 12.8 % (ref 11.5–15.5)

## 2011-10-18 MED ORDER — GUAIFENESIN ER 600 MG PO TB12: 600.0000 mg | ORAL_TABLET | Freq: Two times a day (BID) | ORAL | Status: DC

## 2011-10-18 MED ORDER — AMLODIPINE BESYLATE 5 MG PO TABS: 10.0000 mg | ORAL_TABLET | Freq: Every day | ORAL | Status: DC

## 2011-10-18 MED ORDER — POTASSIUM CHLORIDE CRYS ER 20 MEQ PO TBCR
40.0000 meq | EXTENDED_RELEASE_TABLET | ORAL | Status: DC
  Filled 2011-10-18: qty 2

## 2011-10-18 MED ORDER — PREDNISONE 10 MG PO TABS: 10.0000 mg | ORAL_TABLET | Freq: Every day | ORAL | Status: DC

## 2011-10-18 MED ORDER — MOXIFLOXACIN HCL 400 MG PO TABS: 400.0000 mg | ORAL_TABLET | Freq: Every day | ORAL | Status: AC

## 2011-10-18 NOTE — Discharge Summary (Signed)
Physician Discharge Summary  Patient ID: [name] MRN: [id number] DOB/AGE: 1966/03/26 45 y.o.  Admit date: 10/15/2011 Discharge date: 10/18/2011  Primary Care Physician:  No primary provider on file.   Discharge Diagnoses:    Patient Active Problem List  Diagnoses  . HYPERTENSION  . SINUSITIS, CHRONIC  . ASTHMA, PERSISTENT, SEVERE  . NASAL POLYP  . RHINITIS  . Acute respiratory failure  . SOB (shortness of breath)  . Asthma exacerbation    Current Discharge Medication List    CONTINUE these medications which have NOT CHANGED   Details  albuterol (PROVENTIL) (2.5 MG/3ML) 0.083% nebulizer solution Take 2.5 mg by nebulization every 4 (four) hours as needed. For shortness of breath.    albuterol (VENTOLIN HFA) 108 (90 BASE) MCG/ACT inhaler Inhale 2 puffs into the lungs every 4 (four) hours as needed. For shortness of breath.    budesonide-formoterol (SYMBICORT) 160-4.5 MCG/ACT inhaler Inhale 2 puffs into the lungs 2 (two) times daily.      cetirizine (ZYRTEC) 10 MG tablet Take 10 mg by mouth at bedtime as needed. For allergies.    montelukast (SINGULAIR) 10 MG tablet Take 10 mg by mouth daily.      Multiple Vitamin (MULTIVITAMIN) tablet Take 1 tablet by mouth daily.      olmesartan-hydrochlorothiazide (BENICAR HCT) 20-12.5 MG per tablet Take 1 tablet by mouth 2 (two) times daily.      predniSONE (DELTASONE) 10 MG tablet Take 10 mg by mouth daily.      ranitidine (ZANTAC) 150 MG capsule Take 150 mg by mouth daily.          Disposition and Follow-up:  Follow up with Dr Cyril Mourning, Primary pulmonologist, per prior appointment. Consults:  none  None.  Significant Diagnostic Studies:  Dg Chest 2 View  10/15/2011  *RADIOLOGY REPORT*  Clinical Data: Short of breath and cough  CHEST - 2 VIEW  Comparison: 02/03/2011  Findings: Heart is normal in size.  Bronchitic changes are stable. No consolidation or mass.  No pneumothorax.  No pleural effusion. Calcified  granulomata in the right lower lobe are  IMPRESSION: No active cardiopulmonary disease.  Original Report Authenticated By: Donavan Burnet, M.D.    Brief H and P: For complete details, refer to admission H and P. However, in brief, this is a 45 year old male with severe asthma with multiple hospitalizations for asthma exacerbation,, s/p multiple intubations in the past, presenting with increasing shortness of breath and purulent sputum production, over the preceding 2-3 days prior to admission, associated with postnasal sinus drainage and nasal congestion. He also suffers from chronic sinusitis and has had on sinus surgery in the past. He was admitted for further evaluation, investigation and management, to the stepdown unit, as he was felt to be at high risk for intubation.  Physical Exam: On 10/18/11. General:   Alert, communicative, fully oriented, talking in complete sentences, not short of breath at rest. Eating lunch with gusto. HEENT:  No clinical pallor, no jaundice, no conjunctival injection or discharge. NECK:  Supple, JVP not seen, no carotid bruits, no palpable lymphadenopathy, no palpable goiter. CHEST:  Clinically clear to auscultation, no wheezes, no crackles. HEART:  Sounds 1 and 2 heard, normal, regular, no murmurs. ABDOMEN:  Full, soft, no scars, non-tender, no palpable organomegaly, no palpable masses, normal bowel sounds. LOWER EXTREMITIES:  No pitting edema, palpable peripheral pulses. MUSCULOSKELETAL SYSTEM:  Generalized osteoarthritic changes, otherwise, normal. CENTRAL NERVOUS SYSTEM:  No focal neurologic deficit on gross examination.  Hospital  Course:  Principal Problem:  *Asthma exacerbation:This was infective, secondary to acute bronchitis. Chest x-ray showed no evidence of pneumonic consolidation. Patient responded to antibiotics, bronchodilator nebulizers, O2 and Steroids. By 10/17/11, he felt considerably better, and we were able to transfer him off the SDU. Steroid  taper has been commenced, and he will complete a further 7 days of Avelox therapy on discharge. Fortunately, he did not need BiPAP or intubation.   Active Problems: 1. HYPERTENSION: Patients BP was uncontrolled, during this hospitalization, although he has no previous hx HTN. This was addressed with Norvasc.  2. SINUSITIS, CHRONIC RHINITIS: This remained stable.  SOB (shortness of breath)  Comment: patient was on 10/18/11, considered clinically stable for discharge.  Time spent on Discharge: 1 hour.  Signed: Sinahi Knights,CHRISTOPHER 10/18/2011, 12:47 PM

## 2011-10-22 ENCOUNTER — Ambulatory Visit (INDEPENDENT_AMBULATORY_CARE_PROVIDER_SITE_OTHER): Payer: BC Managed Care – PPO

## 2011-10-22 DIAGNOSIS — J45909 Unspecified asthma, uncomplicated: Secondary | ICD-10-CM

## 2011-10-23 DIAGNOSIS — J45909 Unspecified asthma, uncomplicated: Secondary | ICD-10-CM

## 2011-10-23 MED ORDER — OMALIZUMAB 150 MG ~~LOC~~ SOLR
300.0000 mg | Freq: Once | SUBCUTANEOUS | Status: AC
  Administered 2011-10-23: 300 mg via SUBCUTANEOUS

## 2011-12-10 ENCOUNTER — Ambulatory Visit (INDEPENDENT_AMBULATORY_CARE_PROVIDER_SITE_OTHER): Payer: BC Managed Care – PPO

## 2011-12-10 DIAGNOSIS — J45909 Unspecified asthma, uncomplicated: Secondary | ICD-10-CM

## 2011-12-12 DIAGNOSIS — J45909 Unspecified asthma, uncomplicated: Secondary | ICD-10-CM

## 2011-12-12 MED ORDER — OMALIZUMAB 150 MG ~~LOC~~ SOLR
300.0000 mg | Freq: Once | SUBCUTANEOUS | Status: AC
  Administered 2011-12-12: 300 mg via SUBCUTANEOUS

## 2012-01-11 ENCOUNTER — Other Ambulatory Visit: Payer: Self-pay | Admitting: *Deleted

## 2012-01-11 MED ORDER — ALBUTEROL SULFATE HFA 108 (90 BASE) MCG/ACT IN AERS: 2.0000 | INHALATION_SPRAY | RESPIRATORY_TRACT | Status: DC | PRN

## 2012-01-25 ENCOUNTER — Ambulatory Visit (INDEPENDENT_AMBULATORY_CARE_PROVIDER_SITE_OTHER): Payer: BC Managed Care – PPO

## 2012-01-25 DIAGNOSIS — J45909 Unspecified asthma, uncomplicated: Secondary | ICD-10-CM

## 2012-01-25 MED ORDER — OMALIZUMAB 150 MG ~~LOC~~ SOLR
300.0000 mg | Freq: Once | SUBCUTANEOUS | Status: AC
  Administered 2012-01-25: 300 mg via SUBCUTANEOUS

## 2012-01-28 ENCOUNTER — Other Ambulatory Visit: Payer: Self-pay | Admitting: *Deleted

## 2012-01-28 ENCOUNTER — Telehealth: Payer: Self-pay | Admitting: Pulmonary Disease

## 2012-01-28 MED ORDER — ALBUTEROL SULFATE (2.5 MG/3ML) 0.083% IN NEBU: 2.5000 mg | INHALATION_SOLUTION | Freq: Four times a day (QID) | RESPIRATORY_TRACT | Status: DC | PRN

## 2012-01-28 NOTE — Telephone Encounter (Signed)
Medication sent to pharmacy. Message left on pt phone advising same and the need to schedule follow up.

## 2012-01-29 NOTE — Telephone Encounter (Signed)
Called # provided above but was told pt no longer works there.  Called pt's home # - [phone] [facility] for pt.

## 2012-01-30 NOTE — Telephone Encounter (Signed)
LMOM TCB x1 at home number. 

## 2012-02-04 NOTE — Telephone Encounter (Signed)
LMOM TCB x1 at home number. 

## 2012-02-05 NOTE — Telephone Encounter (Signed)
LMOM for pt TCB x3.  This is our 3rd attempt to contact pt.  Per protocol, will sign off on message and wait for pt to call back.

## 2012-02-11 ENCOUNTER — Telehealth: Payer: Self-pay | Admitting: Pulmonary Disease

## 2012-02-11 NOTE — Telephone Encounter (Signed)
LM and advised the pt that his neb meds have been sent to the pharmacy. Advised if he has any further questions to call back, but nothing further needed from [place]. Carron Curie, CMA

## 2012-02-12 ENCOUNTER — Emergency Department (HOSPITAL_COMMUNITY): Payer: BC Managed Care – PPO

## 2012-02-12 ENCOUNTER — Ambulatory Visit: Payer: BC Managed Care – PPO | Admitting: Internal Medicine

## 2012-02-12 ENCOUNTER — Encounter (HOSPITAL_COMMUNITY): Payer: Self-pay

## 2012-02-12 ENCOUNTER — Inpatient Hospital Stay (HOSPITAL_COMMUNITY)
Admission: EM | Admit: 2012-02-12 | Discharge: 2012-02-13 | DRG: 096 | Disposition: A | Discharge status: Home / Self Care | Payer: BC Managed Care – PPO | Attending: Internal Medicine | Admitting: Internal Medicine

## 2012-02-12 DIAGNOSIS — IMO0002 Reserved for concepts with insufficient information to code with codable children: Secondary | ICD-10-CM

## 2012-02-12 DIAGNOSIS — D72829 Elevated white blood cell count, unspecified: Secondary | ICD-10-CM | POA: Diagnosis present

## 2012-02-12 DIAGNOSIS — I10 Essential (primary) hypertension: Secondary | ICD-10-CM | POA: Diagnosis present

## 2012-02-12 DIAGNOSIS — J069 Acute upper respiratory infection, unspecified: Secondary | ICD-10-CM | POA: Diagnosis present

## 2012-02-12 DIAGNOSIS — E876 Hypokalemia: Secondary | ICD-10-CM | POA: Diagnosis present

## 2012-02-12 DIAGNOSIS — K219 Gastro-esophageal reflux disease without esophagitis: Secondary | ICD-10-CM | POA: Diagnosis present

## 2012-02-12 DIAGNOSIS — J329 Chronic sinusitis, unspecified: Secondary | ICD-10-CM | POA: Diagnosis present

## 2012-02-12 DIAGNOSIS — J45901 Unspecified asthma with (acute) exacerbation: Principal | ICD-10-CM | POA: Diagnosis present

## 2012-02-12 HISTORY — DX: Gastro-esophageal reflux disease without esophagitis: K21.9

## 2012-02-12 HISTORY — DX: Other specified postprocedural states: Z98.890

## 2012-02-12 LAB — CREATININE, SERUM
Creatinine, Ser: 0.99 mg/dL (ref 0.50–1.35)
GFR calc Af Amer: >90 mL/min (ref >90)
GFR calc non Af Amer: >90 mL/min (ref >90)

## 2012-02-12 LAB — COMPREHENSIVE METABOLIC PANEL
Albumin: 3.9 g/dL (ref 3.5–5.2)
Alkaline Phosphatase: 41 U/L (ref 39–117)
BUN: 17 mg/dL (ref 6–23)
Calcium: 9 mg/dL (ref 8.4–10.5)
Creatinine, Ser: 1.02 mg/dL (ref 0.50–1.35)
Potassium: 3.2 mEq/L — ABNORMAL LOW (ref 3.5–5.1)
Total Protein: 6.8 g/dL (ref 6.0–8.3)

## 2012-02-12 LAB — DIFFERENTIAL
Basophils Relative: 0 % (ref 0–1)
Eosinophils Absolute: 0 10*3/uL (ref 0.0–0.7)
Monocytes Relative: 4 % (ref 3–12)
Neutrophils Relative %: 92 % — ABNORMAL HIGH (ref 43–77)

## 2012-02-12 LAB — CBC
HCT: 42.1 % (ref 39.0–52.0)
Hemoglobin: 14.8 g/dL (ref 13.0–17.0)
Hemoglobin: 14.5 g/dL (ref 13.0–17.0)
MCH: 29.1 pg (ref 26.0–34.0)
MCHC: 34.3 g/dL (ref 30.0–36.0)
RBC: 5 MIL/uL (ref 4.22–5.81)

## 2012-02-12 LAB — MAGNESIUM: Magnesium: 2 mg/dL (ref 1.5–2.5)

## 2012-02-12 MED ORDER — ALBUTEROL SULFATE (5 MG/ML) 0.5% IN NEBU
INHALATION_SOLUTION | RESPIRATORY_TRACT | Status: AC
  Filled 2012-02-12: qty 2

## 2012-02-12 MED ORDER — ENOXAPARIN SODIUM 40 MG/0.4ML ~~LOC~~ SOLN
40.0000 mg | SUBCUTANEOUS | Status: DC
  Administered 2012-02-12: 40 mg via SUBCUTANEOUS
  Filled 2012-02-12 (×2): qty 0.4

## 2012-02-12 MED ORDER — ALBUTEROL SULFATE (5 MG/ML) 0.5% IN NEBU
2.5000 mg | INHALATION_SOLUTION | RESPIRATORY_TRACT | Status: DC
  Administered 2012-02-13 (×4): 2.5 mg via RESPIRATORY_TRACT
  Filled 2012-02-12 (×4): qty 0.5

## 2012-02-12 MED ORDER — IPRATROPIUM BROMIDE 0.02 % IN SOLN
0.5000 mg | RESPIRATORY_TRACT | Status: DC
  Administered 2012-02-13 (×4): 0.5 mg via RESPIRATORY_TRACT
  Filled 2012-02-12 (×4): qty 2.5

## 2012-02-12 MED ORDER — ACETAMINOPHEN 650 MG RE SUPP: 650.0000 mg | Freq: Four times a day (QID) | RECTAL | Status: DC | PRN

## 2012-02-12 MED ORDER — PANTOPRAZOLE SODIUM 40 MG PO TBEC
40.0000 mg | DELAYED_RELEASE_TABLET | Freq: Every day | ORAL | Status: DC
  Administered 2012-02-13: 40 mg via ORAL
  Filled 2012-02-12: qty 1

## 2012-02-12 MED ORDER — MAGNESIUM SULFATE 40 MG/ML IJ SOLN
2.0000 g | Freq: Once | INTRAMUSCULAR | Status: AC
  Administered 2012-02-12: 2 g via INTRAVENOUS
  Filled 2012-02-12: qty 50

## 2012-02-12 MED ORDER — IPRATROPIUM BROMIDE 0.02 % IN SOLN
0.5000 mg | Freq: Once | RESPIRATORY_TRACT | Status: AC
  Administered 2012-02-12: 0.5 mg via RESPIRATORY_TRACT
  Filled 2012-02-12: qty 2.5

## 2012-02-12 MED ORDER — ALBUTEROL SULFATE (5 MG/ML) 0.5% IN NEBU
2.5000 mg | INHALATION_SOLUTION | RESPIRATORY_TRACT | Status: DC | PRN
  Administered 2012-02-12: 5 mg via RESPIRATORY_TRACT
  Filled 2012-02-12 (×2): qty 0.5

## 2012-02-12 MED ORDER — ALUM & MAG HYDROXIDE-SIMETH 200-200-20 MG/5ML PO SUSP: 30.0000 mL | Freq: Four times a day (QID) | ORAL | Status: DC | PRN

## 2012-02-12 MED ORDER — SODIUM CHLORIDE 0.9 % IV SOLN: 250.0000 mL | INTRAVENOUS | Status: DC | PRN

## 2012-02-12 MED ORDER — METHYLPREDNISOLONE SODIUM SUCC 125 MG IJ SOLR
60.0000 mg | Freq: Three times a day (TID) | INTRAMUSCULAR | Status: DC
  Administered 2012-02-12 – 2012-02-13 (×2): 60 mg via INTRAVENOUS
  Filled 2012-02-12: qty 2
  Filled 2012-02-12 (×3): qty 0.96
  Filled 2012-02-12: qty 2

## 2012-02-12 MED ORDER — HYDROCHLOROTHIAZIDE 25 MG PO TABS
25.0000 mg | ORAL_TABLET | Freq: Every day | ORAL | Status: DC
  Administered 2012-02-13: 25 mg via ORAL
  Filled 2012-02-12: qty 1

## 2012-02-12 MED ORDER — METHYLPREDNISOLONE SODIUM SUCC 125 MG IJ SOLR: 80.0000 mg | Freq: Three times a day (TID) | INTRAMUSCULAR | Status: DC

## 2012-02-12 MED ORDER — PSEUDOEPHEDRINE HCL ER 120 MG PO TB12
120.0000 mg | ORAL_TABLET | Freq: Two times a day (BID) | ORAL | Status: DC
  Administered 2012-02-12 – 2012-02-13 (×2): 120 mg via ORAL
  Filled 2012-02-12 (×3): qty 1

## 2012-02-12 MED ORDER — ALBUTEROL (5 MG/ML) CONTINUOUS INHALATION SOLN
10.0000 mg/h | INHALATION_SOLUTION | Freq: Once | RESPIRATORY_TRACT | Status: AC
  Administered 2012-02-12: 10 mg/h via RESPIRATORY_TRACT

## 2012-02-12 MED ORDER — SODIUM CHLORIDE 0.9 % IJ SOLN
3.0000 mL | Freq: Two times a day (BID) | INTRAMUSCULAR | Status: DC
  Administered 2012-02-12 – 2012-02-13 (×2): 3 mL via INTRAVENOUS

## 2012-02-12 MED ORDER — ONDANSETRON HCL 4 MG/2ML IJ SOLN: 4.0000 mg | Freq: Four times a day (QID) | INTRAMUSCULAR | Status: DC | PRN

## 2012-02-12 MED ORDER — VALSARTAN-HYDROCHLOROTHIAZIDE 160-25 MG PO TABS: 1.0000 | ORAL_TABLET | Freq: Every day | ORAL | Status: DC

## 2012-02-12 MED ORDER — SODIUM CHLORIDE 0.9 % IJ SOLN: 3.0000 mL | INTRAMUSCULAR | Status: DC | PRN

## 2012-02-12 MED ORDER — SODIUM CHLORIDE 0.9 % IJ SOLN: 3.0000 mL | Freq: Two times a day (BID) | INTRAMUSCULAR | Status: DC

## 2012-02-12 MED ORDER — POTASSIUM CHLORIDE CRYS ER 20 MEQ PO TBCR
40.0000 meq | EXTENDED_RELEASE_TABLET | ORAL | Status: AC
  Administered 2012-02-12 (×2): 40 meq via ORAL
  Filled 2012-02-12 (×2): qty 2

## 2012-02-12 MED ORDER — SENNOSIDES-DOCUSATE SODIUM 8.6-50 MG PO TABS: 1.0000 | ORAL_TABLET | Freq: Every evening | ORAL | Status: DC | PRN

## 2012-02-12 MED ORDER — OXYCODONE HCL 5 MG PO TABS: 5.0000 mg | ORAL_TABLET | ORAL | Status: DC | PRN

## 2012-02-12 MED ORDER — ONDANSETRON HCL 4 MG PO TABS: 4.0000 mg | ORAL_TABLET | Freq: Four times a day (QID) | ORAL | Status: DC | PRN

## 2012-02-12 MED ORDER — IRBESARTAN 150 MG PO TABS
150.0000 mg | ORAL_TABLET | Freq: Every day | ORAL | Status: DC
  Administered 2012-02-13: 150 mg via ORAL
  Filled 2012-02-12: qty 1

## 2012-02-12 MED ORDER — IPRATROPIUM BROMIDE 0.02 % IN SOLN: 0.5000 mg | RESPIRATORY_TRACT | Status: DC | PRN

## 2012-02-12 MED ORDER — ACETAMINOPHEN 325 MG PO TABS
650.0000 mg | ORAL_TABLET | Freq: Four times a day (QID) | ORAL | Status: DC | PRN
  Administered 2012-02-13: 650 mg via ORAL
  Filled 2012-02-12: qty 2

## 2012-02-12 MED ORDER — LORATADINE 10 MG PO TABS
10.0000 mg | ORAL_TABLET | Freq: Every day | ORAL | Status: DC
  Administered 2012-02-13: 10 mg via ORAL
  Filled 2012-02-12: qty 1

## 2012-02-12 MED ORDER — METHYLPREDNISOLONE SODIUM SUCC 125 MG IJ SOLR
125.0000 mg | Freq: Once | INTRAMUSCULAR | Status: AC
  Administered 2012-02-12: 125 mg via INTRAVENOUS
  Filled 2012-02-12: qty 2

## 2012-02-12 MED ORDER — MOXIFLOXACIN HCL IN NACL 400 MG/250ML IV SOLN
400.0000 mg | INTRAVENOUS | Status: DC
  Administered 2012-02-12: 400 mg via INTRAVENOUS
  Filled 2012-02-12 (×2): qty 250

## 2012-02-12 MED ORDER — SODIUM CHLORIDE 0.9 % IV SOLN
Freq: Once | INTRAVENOUS | Status: AC
  Administered 2012-02-12: 12:00:00 via INTRAVENOUS

## 2012-02-12 NOTE — ED Notes (Signed)
Pt reports he is feeling like he is wheezing.  Pt has inspiratory wheezes bilaterally.  MD notified

## 2012-02-12 NOTE — H&P (Signed)
[name] MRN: [id number] DOB/AGE: 05-14-1966 46 y.o. Primary Care Physician:No primary provider on file. Admit date: 02/12/2012 Chief Complaint: Worsening shortness of breath/asthma exacerbation HPI:  [name] is a pleasant 46 year old African American gentleman with a history of steroid-dependent asthma for which she has required intubation 7 times in the past, history of nasal polypectomy and deviated septal surgery in 1998 who presents to the ED with a five-day history of worsening shortness of breath, wheezing, upper respiratory symptoms. Patient states that approximately 5 days prior to admission he had some upper rest for his symptoms including a productive cough of occasionally to the yellowish sputum, fatigue, congestion, and wheezing. Patient stated that over this time. Initially he was using his inhaler every 30 minutes to an hour with minimal improvement in his symptoms. Patient stated that he got into the point where he had to start using his nebulizers every 3-4 hours for symptomatic relief. Patient became short of breath on exertion with some associated chest tightness with his cough. Patient stated that he had to sit up right at night to sleep due to his wheezing. Patient denied any fevers no chills no abdominal pain no dysuria. Patient does endorse some mild fatigue a couple of episodes of diarrhea and some nausea. Patient stated that he went to work on the day of admission and as he was walking to his car got severely short of breath felt very tight EMS was called and he was brought to the ED. Patient was seen in the ED was given an hour-long off nebulizer treatments as well as some IV Solu-Medrol with symptomatic improvement. Will call to admit the patient for further evaluation and management. Chest x-ray which was done in the ED was negative. Labs were not obtained and a such we asked the ED physician to obtain at least a CBC and a comprehensive metabolic profile as well as an  ABG.  Past Medical History  Diagnosis Date  . Unspecified sinusitis (chronic)   . Unspecified essential hypertension   . Nonspecific low blood pressure reading   . Acute respiratory failure   . Unspecified asthma     Feb '12 >> RAST reviewed IgE 183, started on XOLAIR 3/12    . Difficult intubation   . Shortness of breath   . Required emergent intubation 2010    7 different episodes  . GERD (gastroesophageal reflux disease) 02/12/2012    Past Surgical History  Procedure Date  . Vasectomy   . Knee arthroscopy      left x2  . Nasal polyp surgery 2003  . Nasal septum surgery 1998    Prior to Admission medications   Medication Sig Start Date End Date Taking? Authorizing Provider  albuterol (PROVENTIL HFA;VENTOLIN HFA) 108 (90 BASE) MCG/ACT inhaler Inhale 2 puffs into the lungs every 4 (four) hours as needed. For shortness of breath. 01/11/12  Yes Oretha Milch, MD  albuterol (PROVENTIL) (2.5 MG/3ML) 0.083% nebulizer solution Take 3 mLs (2.5 mg total) by nebulization every 6 (six) hours as needed for wheezing. 01/28/12 01/27/13 Yes Oretha Milch, MD  montelukast (SINGULAIR) 10 MG tablet Take 10 mg by mouth daily.   07/03/11 07/02/12 Yes Oretha Milch, MD  Multiple Vitamin (MULTIVITAMIN) tablet Take 1 tablet by mouth daily.     Yes Historical Provider, MD  predniSONE (DELTASONE) 10 MG tablet Take 10 mg by mouth daily. 10/18/11 10/17/12 Yes Laveda Norman, MD  ranitidine (ZANTAC) 150 MG capsule Take 150 mg by mouth daily.  Acid reflux    Yes Historical Provider, MD  valsartan-hydrochlorothiazide (DIOVAN-HCT) 160-25 MG per tablet Take 1 tablet by mouth daily.   Yes Historical Provider, MD    Allergies:  Allergies  Allergen Reactions  . Aspirin     REACTION: asthma flare  . Nsaids     REACTION: worsens asthma    Family History  Problem Relation Age of Onset  . Allergies Sister   . Stroke Mother   . Stroke Father   . Hypertension Mother   . Alzheimer's disease Maternal Uncle      Social History:  reports that he quit smoking about 13 years ago. His smoking use included Cigarettes. He has never used smokeless tobacco. He reports that he drinks alcohol. He reports that he does not use illicit drugs.  ROS: All systems reviewed with the patient and was positive as per HPI otherwise all other systems are negative.  PHYSICAL EXAM: Blood pressure 116/89, pulse 83, temperature 98.4 F (36.9 C), temperature source Oral, resp. rate 20, SpO2 96.00%. General: Well-developed well-nourished in no acute cardiopulmonary distress. Patient is speaking in full sentences.  HEENT: Normocephalic atraumatic. Pupils equal round and reactive to light and accommodation. Extraocular movements intact. Oropharynx is clear, no lesions, no exudates. Neck is supple with no lymphadenopathy. No bruits, no goiter. Heart: Regular rate and rhythm, without murmurs, rubs, gallops. Lungs: Somewhat tight, minimal to mild expiratory wheezing. No crackles. No rhonchi. Abdomen: Soft, nontender, nondistended, positive bowel sounds. Extremities: No clubbing cyanosis or edema with positive pedal pulses. Neuro: Alert and oriented x3. Cranial nerves II through XII are grossly intact. No focal deficits.     EKG: None  No results found for this or any previous visit (from the past 240 hour(s)).   Lab results:  [facility] 02/12/12 1515  NA 141  K 3.2*  CL 105  CO2 27  GLUCOSE 111*  BUN 17  CREATININE 1.02  CALCIUM 9.0  MG --  PHOS --    Basename 02/12/12 1515  AST 14  ALT 16  ALKPHOS 41  BILITOT 0.5  PROT 6.8  ALBUMIN 3.9   No results found for this basename: LIPASE:2,AMYLASE:2 in the last 72 hours  Basename 02/12/12 1515  WBC 13.4*  NEUTROABS 12.3*  HGB 14.8  HCT 43.1  MCV 84.8  PLT 245   No results found for this basename: CKTOTAL:3,CKMB:3,CKMBINDEX:3,TROPONINI:3 in the last 72 hours No components found with this basename: POCBNP:3 No results found for this basename: DDIMER in the  last 72 hours No results found for this basename: HGBA1C:2 in the last 72 hours No results found for this basename: CHOL:2,HDL:2,LDLCALC:2,TRIG:2,CHOLHDL:2,LDLDIRECT:2 in the last 72 hours No results found for this basename: TSH,T4TOTAL,FREET3,T3FREE,THYROIDAB in the last 72 hours No results found for this basename: VITAMINB12:2,FOLATE:2,FERRITIN:2,TIBC:2,IRON:2,RETICCTPCT:2 in the last 72 hours Imaging results:  Dg Chest Portable 1 View  02/12/2012  *RADIOLOGY REPORT*  Clinical Data: Worsening shortness of breath.  History of asthma.  PORTABLE CHEST - 1 VIEW  Comparison: 10/15/2011  Findings: Cardiac and mediastinal contours appear normal.  The lungs appear clear.  No pleural effusion is identified.  IMPRESSION:  No significant abnormality identified.  Original Report Authenticated By: Dellia Cloud, M.D.   Impression/Plan:  Principal Problem:  *Asthma exacerbation Active Problems:  HYPERTENSION  SINUSITIS, CHRONIC  URI (upper respiratory infection)  Hypokalemia  Leukocytosis  GERD (gastroesophageal reflux disease)   #1 acute asthma exacerbation Likely triggered by an acute upper resp infection. Patient does have a history of intubations x7.  Patient is currently speaking in full sentences. Will admit to telemetry. Will check CT of the sinuses to rule out a sinusitis. We'll place on oxygen, IV Solu-Medrol, IV Avelox, nebulizer treatments, will give a dose of magnesium sulfate x1. Will follow. If patient's symptoms worsen may need to consult with pulmonary. We'll monitor for now.  #2 leukocytosis Patient is afebrile. Questionable etiology. Patient is on chronic steroid therapy and this might be the cause of it. We'll get a sputum Gram stain and culture. Will check a urinalysis cultures and sensitivities. Will check a CT of the sinuses to rule out sinusitis. We'll place empirically on IV Avelox secondary to problem #1. Will follow.  #3 hypertension Continue home regimen of  Norvasc.  #4 hypokalemia Likely secondary to albuterol use. Will check a magnesium level. Will replete.  #5 upper respiratory infection Placed on Claritin-D. Symptomatic treatment.  #6 gastroesophageal reflux disease PPI.  #7 prophylaxis PPI for GI prophylaxis, Lovenox for DVT prophylaxis.   [name] 02/12/2012, 5:37 PM

## 2012-02-12 NOTE — ED Notes (Signed)
MD at bedside. 

## 2012-02-12 NOTE — ED Notes (Signed)
Pt refusing ABG at this time.  

## 2012-02-12 NOTE — ED Notes (Signed)
Pt states he has bee "tight" since the weekend.  Pt states he was on his way to his car to go to the md when he became so short of breath he could not make it to the car and EMS was called.  Pt states "i am afraid of being intubated again because of my asthma".  Pt states he used his nebulizer this am and his inhaler PTA with no relief

## 2012-02-12 NOTE — ED Provider Notes (Signed)
History     CSN: [id number]  Arrival date & time 02/12/12  1126   First MD Initiated Contact with Patient 02/12/12 1206      Chief Complaint  Patient presents with  . Shortness of Breath    (Consider location/radiation/quality/duration/timing/severity/associated sxs/prior treatment) Patient is a 46 y.o. male presenting with shortness of breath. The history is provided by the patient.  Shortness of Breath  The current episode started 2 days ago. The problem occurs frequently. The problem has been rapidly worsening. The problem is moderate. The symptoms are relieved by nothing. The symptoms are aggravated by nothing. Associated symptoms include cough, shortness of breath and wheezing. Pertinent negatives include no chest pain, no fever, no rhinorrhea and no sore throat. He is currently using steroids. He has had prior hospitalizations. He has had prior ICU admissions. He has had prior intubations.    Past Medical History  Diagnosis Date  . Unspecified sinusitis (chronic)   . Unspecified essential hypertension   . Nonspecific low blood pressure reading   . Acute respiratory failure   . Unspecified asthma     Feb '12 >> RAST reviewed IgE 183, started on XOLAIR 3/12    . Difficult intubation   . Shortness of breath   . Required emergent intubation 2010    7 different episodes    Past Surgical History  Procedure Date  . Vasectomy   . Knee arthroscopy      left x2  . Nasal polyp surgery 2003  . Nasal septum surgery 1998    Family History  Problem Relation Age of Onset  . Allergies Sister   . Stroke Mother   . Stroke Father   . Hypertension Mother   . Alzheimer's disease Maternal Uncle     History  Substance Use Topics  . Smoking status: Former Smoker    Types: Cigarettes    Quit date: 11/26/1998  . Smokeless tobacco: Never Used  . Alcohol Use: Yes     socially      Review of Systems  Constitutional: Negative for fever and chills.  HENT: Negative for  congestion, sore throat and rhinorrhea.   Respiratory: Positive for cough, chest tightness, shortness of breath and wheezing.   Cardiovascular: Negative for chest pain and leg swelling.  Gastrointestinal: Negative for nausea, vomiting, abdominal pain, constipation and blood in stool.  Genitourinary: Negative for decreased urine volume and difficulty urinating.  Neurological: Negative for headaches.  Psychiatric/Behavioral: Negative for confusion.  All other systems reviewed and are negative.    Allergies  Aspirin and Nsaids  Home Medications   Current Outpatient Rx  Name Route Sig Dispense Refill  . ALBUTEROL SULFATE (2.5 MG/3ML) 0.083% IN NEBU Nebulization Take 2.5 mg by nebulization every 4 (four) hours as needed. For shortness of breath.    . ALBUTEROL SULFATE (2.5 MG/3ML) 0.083% IN NEBU Nebulization Take 3 mLs (2.5 mg total) by nebulization every 6 (six) hours as needed for wheezing. 75 mL 0  . ALBUTEROL SULFATE HFA 108 (90 BASE) MCG/ACT IN AERS Inhalation Inhale 2 puffs into the lungs every 4 (four) hours as needed. For shortness of breath. 1 Inhaler 6  . AMLODIPINE BESYLATE 5 MG PO TABS Oral Take 2 tablets (10 mg total) by mouth daily. 60 tablet 0  . BUDESONIDE-FORMOTEROL FUMARATE 160-4.5 MCG/ACT IN AERO Inhalation Inhale 2 puffs into the lungs 2 (two) times daily.      Marland Kitchen CETIRIZINE HCL 10 MG PO TABS Oral Take 10 mg by mouth at bedtime  as needed. For allergies.    Marland Kitchen GUAIFENESIN ER 600 MG PO TB12 Oral Take 1 tablet (600 mg total) by mouth 2 (two) times daily. 14 tablet 0  . MONTELUKAST SODIUM 10 MG PO TABS Oral Take 10 mg by mouth daily.      Marland Kitchen ONE-DAILY MULTI VITAMINS PO TABS Oral Take 1 tablet by mouth daily.      Marland Kitchen OLMESARTAN MEDOXOMIL-HCTZ 20-12.5 MG PO TABS Oral Take 1 tablet by mouth 2 (two) times daily.      Marland Kitchen PREDNISONE 10 MG PO TABS Oral Take 1 tablet (10 mg total) by mouth daily. 27 tablet 0    From 10/19/2011, take 40 mg daily for 3 days, then ...  . RANITIDINE HCL 150  MG PO CAPS Oral Take 150 mg by mouth daily.       BP 154/83  Pulse 90  Temp(Src) 98.4 F (36.9 C) (Oral)  Resp 17  SpO2 96%  Physical Exam  Nursing note and vitals reviewed. Constitutional: He is oriented to person, place, and time. He appears well-developed and well-nourished.  HENT:  Head: Normocephalic and atraumatic.  Right Ear: External ear normal.  Left Ear: External ear normal.  Nose: Nose normal.  Neck: Neck supple.  Cardiovascular: Normal rate, regular rhythm, normal heart sounds and intact distal pulses.   Pulmonary/Chest: No accessory muscle usage. Tachypnea noted. No respiratory distress. He has decreased breath sounds. He has wheezes.  Abdominal: Soft. He exhibits no distension and no mass. There is no tenderness. There is no rebound and no guarding.  Musculoskeletal: He exhibits no edema.  Lymphadenopathy:    He has no cervical adenopathy.  Neurological: He is alert and oriented to person, place, and time.  Skin: Skin is warm and dry.    ED Course  Procedures (including critical care time)  Labs Reviewed - No data to display Dg Chest Portable 1 View  02/12/2012  *RADIOLOGY REPORT*  Clinical Data: Worsening shortness of breath.  History of asthma.  PORTABLE CHEST - 1 VIEW  Comparison: 10/15/2011  Findings: Cardiac and mediastinal contours appear normal.  The lungs appear clear.  No pleural effusion is identified.  IMPRESSION:  No significant abnormality identified.  Original Report Authenticated By: Dellia Cloud, M.D.     1. Asthma exacerbation       MDM  46 yo male with asthma exacerbation. Hx of 7 intubations in the past, feels like he's catching it early this time. Mild tachypnea w/o retractions, is able to speak in full sentences. No distress noted. Symptoms c/w viral URI causing asthma exacerbation. Continuous albuterol given, as well as IV steroids. CXR negative for acute cause to dyspnea. Will admit to [facility] due to decreased FEV1, as well as his  severe history, as well as continued symptoms despite improvement. Will check ABG, CBC, CMP for the hospitalist, though no hypoxia noted and patient is resting comfortably at this time.        Pricilla Loveless, MD 02/12/12 1520

## 2012-02-12 NOTE — ED Notes (Signed)
Respiratory called for ABG

## 2012-02-13 LAB — COMPREHENSIVE METABOLIC PANEL
ALT: 16 U/L (ref 0–53)
AST: 16 U/L (ref 0–37)
Albumin: 3.6 g/dL (ref 3.5–5.2)
Alkaline Phosphatase: 41 U/L (ref 39–117)
Chloride: 108 mEq/L (ref 96–112)
Potassium: 4.5 mEq/L (ref 3.5–5.1)
Sodium: 140 mEq/L (ref 135–145)
Total Bilirubin: 0.2 mg/dL — ABNORMAL LOW (ref 0.3–1.2)
Total Protein: 6.6 g/dL (ref 6.0–8.3)

## 2012-02-13 LAB — CBC
HCT: 43.1 % (ref 39.0–52.0)
MCH: 28.4 pg (ref 26.0–34.0)
MCHC: 32.7 g/dL (ref 30.0–36.0)
MCV: 86.7 fL (ref 78.0–100.0)
RDW: 13.2 % (ref 11.5–15.5)
WBC: 9.6 10*3/uL (ref 4.0–10.5)

## 2012-02-13 LAB — URINALYSIS, ROUTINE W REFLEX MICROSCOPIC
Glucose, UA: 500 mg/dL — AB
Hgb urine dipstick: NEGATIVE
Leukocytes, UA: NEGATIVE
Protein, ur: NEGATIVE mg/dL
Specific Gravity, Urine: 1.035 — ABNORMAL HIGH (ref 1.005–1.030)
pH: 5.5 (ref 5.0–8.0)

## 2012-02-13 LAB — DIFFERENTIAL
Basophils Absolute: 0 10*3/uL (ref 0.0–0.1)
Eosinophils Absolute: 0 10*3/uL (ref 0.0–0.7)
Eosinophils Relative: 0 % (ref 0–5)
Lymphocytes Relative: 12 % (ref 12–46)
Monocytes Absolute: 0.4 10*3/uL (ref 0.1–1.0)

## 2012-02-13 MED ORDER — GUAIFENESIN-DM 100-10 MG/5ML PO SYRP: 5.0000 mL | ORAL_SOLUTION | Freq: Three times a day (TID) | ORAL | Status: AC | PRN

## 2012-02-13 MED ORDER — LEVOFLOXACIN 500 MG PO TABS: 500.0000 mg | ORAL_TABLET | Freq: Every day | ORAL | Status: AC

## 2012-02-13 MED ORDER — ALBUTEROL SULFATE (2.5 MG/3ML) 0.083% IN NEBU: 2.5000 mg | INHALATION_SOLUTION | Freq: Four times a day (QID) | RESPIRATORY_TRACT | Status: DC | PRN

## 2012-02-13 MED ORDER — PREDNISONE 10 MG PO TABS: ORAL_TABLET | ORAL | Status: DC

## 2012-02-13 MED ORDER — OMEPRAZOLE 40 MG PO CPDR: 40.0000 mg | DELAYED_RELEASE_CAPSULE | Freq: Every day | ORAL | Status: AC

## 2012-02-13 MED ORDER — IPRATROPIUM BROMIDE 0.02 % IN SOLN: 0.5000 mg | Freq: Four times a day (QID) | RESPIRATORY_TRACT | Status: AC | PRN

## 2012-02-13 MED ORDER — ALBUTEROL SULFATE HFA 108 (90 BASE) MCG/ACT IN AERS: 2.0000 | INHALATION_SPRAY | RESPIRATORY_TRACT | Status: DC | PRN

## 2012-02-13 NOTE — Discharge Summary (Signed)
Patient ID: [name] MRN: [id number] DOB/AGE: 1965/12/12 46 y.o. Primary Care Physician:SHELTON,KIMBERLY R., MD, MD Admit date: 02/12/2012 Discharge date: 02/13/2012    Discharge Diagnoses:   Principal Problem:  *Asthma exacerbation Active Problems:  HYPERTENSION  SINUSITIS, CHRONIC  URI (upper respiratory infection)  Hypokalemia  Leukocytosis  GERD (gastroesophageal reflux disease)   Medication List  As of 02/13/2012  4:03 PM   START taking these medications         * albuterol 108 (90 BASE) MCG/ACT inhaler   Commonly known as: PROVENTIL HFA;VENTOLIN HFA   Inhale 2 puffs into the lungs every 4 (four) hours as needed. For shortness of breath.      guaiFENesin-dextromethorphan 100-10 MG/5ML syrup   Commonly known as: ROBITUSSIN DM   Take 5 mLs by mouth 3 (three) times daily as needed for cough.      ipratropium 0.02 % nebulizer solution   Commonly known as: ATROVENT   Take 2.5 mLs (0.5 mg total) by nebulization every 6 (six) hours as needed for wheezing.      levofloxacin 500 MG tablet   Commonly known as: LEVAQUIN   Take 1 tablet (500 mg total) by mouth daily.      omeprazole 40 MG capsule   Commonly known as: PRILOSEC   Take 1 capsule (40 mg total) by mouth daily.     * Notice: This list has 1 medication(s) that are the same as other medications prescribed for you. Read the directions carefully, and ask your doctor or other care provider to review them with you.       CHANGE how you take these medications         predniSONE 10 MG tablet   Commonly known as: DELTASONE   8/day for 2 days then 7/day for 2 days then 6/day for 2 days then 5/day for 2 days then 4/day for 2 days then 3/day for 2 days then 1 daily   What changed: - dose - route (how to take the med) - how often to take the med - doctor's instructions         CONTINUE taking these medications         * albuterol 108 (90 BASE) MCG/ACT inhaler   Commonly known as: PROVENTIL HFA;VENTOLIN HFA        * albuterol (2.5 MG/3ML) 0.083% nebulizer solution   Commonly known as: PROVENTIL   Take 3 mLs (2.5 mg total) by nebulization every 6 (six) hours as needed for wheezing.      montelukast 10 MG tablet   Commonly known as: SINGULAIR      multivitamin tablet      ranitidine 150 MG capsule   Commonly known as: ZANTAC      valsartan-hydrochlorothiazide 160-25 MG per tablet   Commonly known as: DIOVAN-HCT     * Notice: This list has 2 medication(s) that are the same as other medications prescribed for you. Read the directions carefully, and ask your doctor or other care provider to review them with you.        Where to get your medications    These are the prescriptions that you need to pick up.   You may get these medications from any pharmacy.         albuterol (2.5 MG/3ML) 0.083% nebulizer solution   albuterol 108 (90 BASE) MCG/ACT inhaler   guaiFENesin-dextromethorphan 100-10 MG/5ML syrup   ipratropium 0.02 % nebulizer solution   levofloxacin 500 MG tablet   omeprazole 40  MG capsule   predniSONE 10 MG tablet            Discharged Condition: Good    Consults: None  Significant Diagnostic Studies: Dg Chest Portable 1 View  02/12/2012  *RADIOLOGY REPORT*  Clinical Data: Worsening shortness of breath.  History of asthma.  PORTABLE CHEST - 1 VIEW  Comparison: 10/15/2011  Findings: Cardiac and mediastinal contours appear normal.  The lungs appear clear.  No pleural effusion is identified.  IMPRESSION:  No significant abnormality identified.  Original Report Authenticated By: Dellia Cloud, M.D.   Ct Maxillofacial Ltd Wo Cm  02/12/2012  *RADIOLOGY REPORT*  Clinical Data:  Evaluate for sinusitis.  Complains of headache  CT MAXILLOFACIAL WITHOUT CONTRAST  Technique:  Multidetector CT imaging of the maxillofacial structures was performed.  Multiplanar CT image reconstructions were also generated.  A small metallic BB was placed on the right temple in order to reliably  differentiate right from left.  Comparison:  11/13/2010  Findings:  Moderate to marked bilateral maxillary sinus mucosal thickening is noted.  This measures up to 8 mm in thickness.  There are fluid levels identified within the sphenoid sinus.  Moderate to marked opacification of the ethmoid air cells noted.   There is mild to moderate mucosal thickening involving the frontal sinuses.  The mastoid air cells appear clear.  The skull appears intact.  IMPRESSION:  1.  Moderate to marked chronic appearing pan sinus disease.  Original Report Authenticated By: Rosealee Albee, M.D.    Lab Results: Results for orders placed during the hospital encounter of 02/12/12 (from the past 48 hour(s))  CBC     Status: Abnormal   Collection Time   02/12/12  3:15 PM      Component Value Range Comment   WBC 13.4 (*) 4.0 - 10.5 (K/uL)    RBC 5.08  4.22 - 5.81 (MIL/uL)    Hemoglobin 14.8  13.0 - 17.0 (g/dL)    HCT 16.1  09.6 - 04.5 (%)    MCV 84.8  78.0 - 100.0 (fL)    MCH 29.1  26.0 - 34.0 (pg)    MCHC 34.3  30.0 - 36.0 (g/dL)    RDW 40.9  81.1 - 91.4 (%)    Platelets 245  150 - 400 (K/uL)   DIFFERENTIAL     Status: Abnormal   Collection Time   02/12/12  3:15 PM      Component Value Range Comment   Neutrophils Relative 92 (*) 43 - 77 (%)    Neutro Abs 12.3 (*) 1.7 - 7.7 (K/uL)    Lymphocytes Relative 4 (*) 12 - 46 (%)    Lymphs Abs 0.5 (*) 0.7 - 4.0 (K/uL)    Monocytes Relative 4  3 - 12 (%)    Monocytes Absolute 0.5  0.1 - 1.0 (K/uL)    Eosinophils Relative 0  0 - 5 (%)    Eosinophils Absolute 0.0  0.0 - 0.7 (K/uL)    Basophils Relative 0  0 - 1 (%)    Basophils Absolute 0.0  0.0 - 0.1 (K/uL)   COMPREHENSIVE METABOLIC PANEL     Status: Abnormal   Collection Time   02/12/12  3:15 PM      Component Value Range Comment   Sodium 141  135 - 145 (mEq/L)    Potassium 3.2 (*) 3.5 - 5.1 (mEq/L)    Chloride 105  96 - 112 (mEq/L)    CO2 27  19 - 32 (mEq/L)  Glucose, Bld 111 (*) 70 - 99 (mg/dL)    BUN 17  6  - 23 (mg/dL)    Creatinine, Ser 1.61  0.50 - 1.35 (mg/dL)    Calcium 9.0  8.4 - 10.5 (mg/dL)    Total Protein 6.8  6.0 - 8.3 (g/dL)    Albumin 3.9  3.5 - 5.2 (g/dL)    AST 14  0 - 37 (U/L)    ALT 16  0 - 53 (U/L)    Alkaline Phosphatase 41  39 - 117 (U/L)    Total Bilirubin 0.5  0.3 - 1.2 (mg/dL)    GFR calc non Af Amer 88 (*) >90 (mL/min)    GFR calc Af Amer >90  >90 (mL/min)   MAGNESIUM     Status: Normal   Collection Time   02/12/12  3:15 PM      Component Value Range Comment   Magnesium 2.0  1.5 - 2.5 (mg/dL)   CBC     Status: Normal   Collection Time   02/12/12 10:05 PM      Component Value Range Comment   WBC 8.4  4.0 - 10.5 (K/uL)    RBC 5.00  4.22 - 5.81 (MIL/uL)    Hemoglobin 14.5  13.0 - 17.0 (g/dL)    HCT 09.6  04.5 - 40.9 (%)    MCV 84.2  78.0 - 100.0 (fL)    MCH 29.0  26.0 - 34.0 (pg)    MCHC 34.4  30.0 - 36.0 (g/dL)    RDW 81.1  91.4 - 78.2 (%)    Platelets 247  150 - 400 (K/uL)   CREATININE, SERUM     Status: Normal   Collection Time   02/12/12 10:05 PM      Component Value Range Comment   Creatinine, Ser 0.99  0.50 - 1.35 (mg/dL)    GFR calc non Af Amer >90  >90 (mL/min)    GFR calc Af Amer >90  >90 (mL/min)   URINALYSIS, ROUTINE W REFLEX MICROSCOPIC     Status: Abnormal   Collection Time   02/13/12  4:07 AM      Component Value Range Comment   Color, Urine YELLOW  YELLOW     APPearance CLEAR  CLEAR     Specific Gravity, Urine 1.035 (*) 1.005 - 1.030     pH 5.5  5.0 - 8.0     Glucose, UA 500 (*) NEGATIVE (mg/dL)    Hgb urine dipstick NEGATIVE  NEGATIVE     Bilirubin Urine NEGATIVE  NEGATIVE     Ketones, ur 15 (*) NEGATIVE (mg/dL)    Protein, ur NEGATIVE  NEGATIVE (mg/dL)    Urobilinogen, UA 0.2  0.0 - 1.0 (mg/dL)    Nitrite NEGATIVE  NEGATIVE     Leukocytes, UA NEGATIVE  NEGATIVE  MICROSCOPIC NOT DONE ON URINES WITH NEGATIVE PROTEIN, BLOOD, LEUKOCYTES, NITRITE, OR GLUCOSE <1000 mg/dL.  COMPREHENSIVE METABOLIC PANEL     Status: Abnormal   Collection  Time   02/13/12  5:41 AM      Component Value Range Comment   Sodium 140  135 - 145 (mEq/L)    Potassium 4.5  3.5 - 5.1 (mEq/L)    Chloride 108  96 - 112 (mEq/L)    CO2 23  19 - 32 (mEq/L)    Glucose, Bld 134 (*) 70 - 99 (mg/dL)    BUN 17  6 - 23 (mg/dL)    Creatinine, Ser 9.56  0.50 - 1.35 (mg/dL)  Calcium 9.0  8.4 - 10.5 (mg/dL)    Total Protein 6.6  6.0 - 8.3 (g/dL)    Albumin 3.6  3.5 - 5.2 (g/dL)    AST 16  0 - 37 (U/L) HEMOLYSIS AT THIS LEVEL MAY AFFECT RESULT   ALT 16  0 - 53 (U/L)    Alkaline Phosphatase 41  39 - 117 (U/L)    Total Bilirubin 0.2 (*) 0.3 - 1.2 (mg/dL)    GFR calc non Af Amer >90  >90 (mL/min)    GFR calc Af Amer >90  >90 (mL/min)   CBC     Status: Normal   Collection Time   02/13/12  5:41 AM      Component Value Range Comment   WBC 9.6  4.0 - 10.5 (K/uL)    RBC 4.97  4.22 - 5.81 (MIL/uL)    Hemoglobin 14.1  13.0 - 17.0 (g/dL)    HCT 40.9  81.1 - 91.4 (%)    MCV 86.7  78.0 - 100.0 (fL)    MCH 28.4  26.0 - 34.0 (pg)    MCHC 32.7  30.0 - 36.0 (g/dL)    RDW 78.2  95.6 - 21.3 (%)    Platelets 252  150 - 400 (K/uL)   DIFFERENTIAL     Status: Abnormal   Collection Time   02/13/12  5:41 AM      Component Value Range Comment   Neutrophils Relative 83 (*) 43 - 77 (%)    Neutro Abs 8.0 (*) 1.7 - 7.7 (K/uL)    Lymphocytes Relative 12  12 - 46 (%)    Lymphs Abs 1.2  0.7 - 4.0 (K/uL)    Monocytes Relative 4  3 - 12 (%)    Monocytes Absolute 0.4  0.1 - 1.0 (K/uL)    Eosinophils Relative 0  0 - 5 (%)    Eosinophils Absolute 0.0  0.0 - 0.7 (K/uL)    Basophils Relative 0  0 - 1 (%)    Basophils Absolute 0.0  0.0 - 0.1 (K/uL)   MAGNESIUM     Status: Normal   Collection Time   02/13/12  5:41 AM      Component Value Range Comment   Magnesium 2.4  1.5 - 2.5 (mg/dL)    No results found for this or any previous visit (from the past 240 hour(s)).   Hospital Course:  46 year old man with known asthma presented to the emergency room with complaints of sudden onset of  severe dyspnea. He was admitted for an exacerbation and started on high doses of IV steroids and magnesium sulfate IV. Due to the concomitant presence of sinusitis he was also started on IV antibiotics. By hospital day #2 he was feeling better with peak flow rate into the 450s. He was transitioned to oral steroids, continued on nebulized albuterol and Atrovent and he will continue oral Levaquin. He will followup with his primary pulmonologist in the office.  Discharge Exam: Blood pressure 157/90, pulse 81, temperature 98 F (36.7 C), temperature source Oral, resp. rate 16, height 6' (1.829 m), weight 79.8 kg (175 lb 14.8 oz), SpO2 94.00%. Alert oriented x3 Able to speak in full sentences Chest clear to auscultation with good air movement  Disposition: Home  Discharge Orders    Future Orders Please Complete By Expires   Diet general      Increase activity slowly         Follow-up Information    Follow up with [facility] R.,  MD.   Benay Pillow information:   [address] [place] [address] [phone]       Schedule an appointment as soon as possible for a visit with Oretha Milch., MD.   Contact information:   [facility] [address] [place] [address] [phone]          Signed: Lonia Blood 02/13/2012, 4:03 PM

## 2012-02-13 NOTE — Progress Notes (Signed)
Pt discharged to home per MD order.  IV removed, site unremarkable, dressing and pressure applied.  Prescriptions, follow-up appointments and discharge teaching given.  Pt refused transport by wheelchair.  Pt traveled home with family by private vehicle.

## 2012-02-13 NOTE — Consult Note (Signed)
Patients pre peak flow was 210 x 2 with good effort. Post Peak flow was 375 x 2 with marked improvement.

## 2012-02-13 NOTE — Consult Note (Signed)
Patients pre peak flow was 250 x 2 with good effort. Post peak flow was 315 x 2 with improvement from medications.

## 2012-02-13 NOTE — Progress Notes (Signed)
   CARE MANAGEMENT NOTE 02/13/2012  Patient:  [name], [name]   Account Number:  [id number]  Date Initiated:  02/13/2012  Documentation initiated by:  Letha Cape  Subjective/Objective Assessment:   dx asthma ex  admit- lives alone, pta independent.     Action/Plan:   Anticipated DC Date:  02/13/2012   Anticipated DC Plan:  HOME/SELF CARE      DC Planning Services  CM consult      Choice offered to / List presented to:             Status of service:  Completed, signed off Medicare Important Message given?   (If response is "NO", the following Medicare IM given date fields will be blank) Date Medicare IM given:   Date Additional Medicare IM given:    Discharge Disposition:  HOME/SELF CARE  Per UR Regulation:    If discussed at Long Length of Stay Meetings, dates discussed:    Comments:  02/13/12 13:56 Letha Cape RN, BSN [phone] patient lives alone, pta independent.  No needs identified. Patient dc d today.

## 2012-02-14 LAB — URINE CULTURE
Colony Count: NO GROWTH
Culture  Setup Time: 201303200432
Culture: NO GROWTH

## 2012-02-14 NOTE — ED Provider Notes (Signed)
I saw and evaluated the patient, reviewed the resident's note and I agree with the findings and plan.  Dione Booze, MD 02/14/12 [phone]

## 2012-02-18 ENCOUNTER — Telehealth: Payer: Self-pay | Admitting: *Deleted

## 2012-02-18 NOTE — Telephone Encounter (Signed)
lmomtcb x1 for pt 

## 2012-02-18 NOTE — Telephone Encounter (Signed)
Message copied by Tommie Sams on Mon Feb 18, 2012  2:02 PM ------      Message from: Cyril Mourning V      Created: Mon Feb 18, 2012 11:42 AM       Needs appt with TP within 1 week pl

## 2012-02-19 NOTE — Telephone Encounter (Signed)
lmomtcb x 2  

## 2012-02-20 NOTE — Telephone Encounter (Signed)
lmomtcb x 3  

## 2012-02-26 NOTE — Telephone Encounter (Signed)
lmomtcb  

## 2012-02-27 NOTE — Telephone Encounter (Signed)
lmomtcb  

## 2012-03-03 ENCOUNTER — Encounter: Payer: Self-pay | Admitting: *Deleted

## 2012-03-03 NOTE — Telephone Encounter (Signed)
ATC pt again and still NA. I sent letter out to pt home advising him to contact our office for an apt

## 2012-03-13 ENCOUNTER — Telehealth: Payer: Self-pay | Admitting: Pulmonary Disease

## 2012-03-13 ENCOUNTER — Ambulatory Visit (INDEPENDENT_AMBULATORY_CARE_PROVIDER_SITE_OTHER): Payer: BC Managed Care – PPO

## 2012-03-13 DIAGNOSIS — J45909 Unspecified asthma, uncomplicated: Secondary | ICD-10-CM

## 2012-03-13 NOTE — Telephone Encounter (Signed)
noted 

## 2012-03-14 DIAGNOSIS — J45909 Unspecified asthma, uncomplicated: Secondary | ICD-10-CM

## 2012-03-14 MED ORDER — OMALIZUMAB 150 MG ~~LOC~~ SOLR
300.0000 mg | Freq: Once | SUBCUTANEOUS | Status: AC
  Administered 2012-03-14: 300 mg via SUBCUTANEOUS

## 2012-03-18 ENCOUNTER — Telehealth: Payer: Self-pay | Admitting: Adult Health

## 2012-03-18 MED ORDER — AMOXICILLIN-POT CLAVULANATE 875-125 MG PO TABS: 1.0000 | ORAL_TABLET | Freq: Two times a day (BID) | ORAL | Status: AC

## 2012-03-18 NOTE — Telephone Encounter (Signed)
Per SN- give augmentin 875 1 po bid #14 and needs OV ASAP.  Rx sent and pt is aware.Carron Curie, CMA

## 2012-03-18 NOTE — Telephone Encounter (Signed)
Dr. [name] asthma patient, on xolair. I spoke with the patient and he is c/o productive cough with green phlegm, chest tightness, chest congestion and wheezing all x 3 days. I advised the pt that since last OV was in august 2012 he would need an appt. 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  He has an upcoming appt on 03-25-12. Please advise. Carron Curie, CMA Allergies  Allergen Reactions  . Aspirin     REACTION: asthma flare  . Nsaids     REACTION: worsens asthma

## 2012-03-20 ENCOUNTER — Ambulatory Visit: Payer: BC Managed Care – PPO | Admitting: Adult Health

## 2012-03-25 ENCOUNTER — Encounter: Payer: Self-pay | Admitting: Adult Health

## 2012-03-25 ENCOUNTER — Ambulatory Visit (INDEPENDENT_AMBULATORY_CARE_PROVIDER_SITE_OTHER): Payer: BC Managed Care – PPO | Admitting: Adult Health

## 2012-03-25 VITALS — BP 146/98 | HR 65 | Temp 97.0°F | Ht 72.0 in | Wt 187.6 lb

## 2012-03-25 DIAGNOSIS — J45909 Unspecified asthma, uncomplicated: Secondary | ICD-10-CM

## 2012-03-25 MED ORDER — AMOXICILLIN-POT CLAVULANATE 875-125 MG PO TABS: 1.0000 | ORAL_TABLET | Freq: Two times a day (BID) | ORAL | Status: AC

## 2012-03-25 NOTE — Patient Instructions (Signed)
Extend Augmentin 875mg  Twice daily  For 7 days  Mucinex DM Twice daily  As needed  Cough/congestion  Saline nasal rinses As needed   RESTART SYMBICORT 2 PUFFS Twice daily  -THIS IS YOUR CONTROLLER FOR YOUR ASTHMA  DO NOT MISS ANY DOSES OF THIS INHALER  TAKE ALBUTEROL INHALER ONLY AS NEEDED -THIS IS YOUR EMERGENCY INHALER.  follow up Dr. [name]  In 1 month and As needed

## 2012-03-26 ENCOUNTER — Encounter: Payer: Self-pay | Admitting: Adult Health

## 2012-03-26 NOTE — Assessment & Plan Note (Signed)
Recurrent flares with associated sinusitis  Plan:  Extend Augmentin 875mg  Twice daily  For 7 days  Mucinex DM Twice daily  As needed  Cough/congestion  Saline nasal rinses As needed   RESTART SYMBICORT 2 PUFFS Twice daily  -THIS IS YOUR CONTROLLER FOR YOUR ASTHMA  DO NOT MISS ANY DOSES OF THIS INHALER  TAKE ALBUTEROL INHALER ONLY AS NEEDED -THIS IS YOUR EMERGENCY INHALER.  follow up Dr. [name]  In 1 month and As needed

## 2012-03-26 NOTE — Progress Notes (Signed)
Subjective:    Patient ID: [name], male    DOB: Apr 15, 1966, 46 y.o.   MRN: [id number]  HPI 46 yo [place] American male for FU of severe persistent asthma difficult to control in past requiring multiple intubations.  Asthma Onset age 17 when he worked in a [employer], ETT x 6, last in 2/10, NSAID ( Advil) incuded, Allergy skin test neg, s/p nasal surgery for polyps & deviated septum, baseline prednisone 10 alternating with 5 mg, BPF > 600  CT sinuses 3/10 - widespread sinus inflammatory disease ? polyp  Admitted 12/18-12/22/11  for asthma exacerbation and sinusitis. CT sinus showed bilateral sinusitis.  Feb '12 >> RAST reviewed IgE 183, started on XOLAIR 3/12  Skin test- Positives for grass, weed and tree. 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 No nocturnal symptoms, compliant with symbicort, singulair has dropped off his list   03/25/12 [facility] follow up  Patient states much better since being out of the hospital. Does have  sob and wheezing every now and then, and cough with milky mucus. Denies chest pain and chest tightness.  Not taking symbicort-now for some time now. We discussed the importance of controller meds vs As needed  meds in asthma. Long discussion on severity of asthma flares and dangers of uncontrolled asthma. He has been admitted x 2 in last 6 months for asthma . Last 02/12/12 , tx w/ steroids and nebs.  He has assured me he will stay on controller meds and keep follow up appointment.  CT done last admission Positive for sinusitis.  Called office last week for cough and sinus congestion , tx w/ augmentin x 7 days some better but still has cough and sinus congestion .   Review of  Systems Constitutional:   No  weight loss, night sweats,  Fevers, chills, fatigue, or  lassitude.  HEENT:   No headaches,  Difficulty swallowing,  Tooth/dental problems, or  Sore throat,                No sneezing, itching, ear ache,  +nasal congestion, post nasal drip,   CV:  No chest pain,  Orthopnea, PND, swelling in lower extremities, anasarca, dizziness, palpitations, syncope.   GI  No heartburn, indigestion, abdominal pain, nausea, vomiting, diarrhea, change in bowel habits, loss of appetite, bloody stools.   Resp:    No coughing up of blood.  No change in color of mucus.  No wheezing.  No chest wall deformity  Skin: no rash or lesions.  GU: no dysuria, change in color of urine, no urgency or frequency.  No flank pain, no hematuria   MS:  No joint pain or swelling.  No decreased range of motion.  No back pain.  Psych:  No change in mood or affect. No depression or anxiety.  No memory loss.         Objective:   Physical Exam GEN: A/Ox3; pleasant , NAD, well nourished   HEENT:  [place]/AT,  EACs-clear, TMs-wnl, NOSE-clear, THROAT-clear, no lesions, no postnasal drip or exudate noted.   NECK:  Supple w/ fair ROM; no JVD; normal carotid impulses w/o bruits; no thyromegaly or nodules palpated; no lymphadenopathy.  RESP  Clear  P & A;  w/o, wheezes/ rales/ or rhonchi.no accessory muscle use, no dullness to percussion  CARD:  RRR, no m/r/g  , no peripheral edema, pulses intact, no cyanosis or clubbing.  GI:   Soft & nt; nml bowel sounds; no organomegaly or masses detected.  Musco: Warm bil, no deformities or joint swelling noted.   Neuro: alert, no focal deficits noted.    Skin: Warm, no lesions or rashes         Assessment & Plan:

## 2012-04-24 ENCOUNTER — Encounter: Payer: Self-pay | Admitting: Adult Health

## 2012-04-24 ENCOUNTER — Ambulatory Visit (INDEPENDENT_AMBULATORY_CARE_PROVIDER_SITE_OTHER): Payer: BC Managed Care – PPO | Admitting: Adult Health

## 2012-04-24 VITALS — BP 160/80 | HR 69 | Temp 97.2°F | Ht 72.0 in | Wt 194.4 lb

## 2012-04-24 DIAGNOSIS — J45909 Unspecified asthma, uncomplicated: Secondary | ICD-10-CM

## 2012-04-24 MED ORDER — BUDESONIDE-FORMOTEROL FUMARATE 160-4.5 MCG/ACT IN AERO: 2.0000 | INHALATION_SPRAY | Freq: Two times a day (BID) | RESPIRATORY_TRACT | Status: AC

## 2012-04-24 NOTE — Patient Instructions (Signed)
REMAIN SYMBICORT 2 PUFFS Twice daily  -THIS IS YOUR CONTROLLER FOR YOUR ASTHMA  DO NOT MISS ANY DOSES OF THIS INHALER  TAKE ALBUTEROL INHALER ONLY AS NEEDED -THIS IS YOUR EMERGENCY INHALER.  Restart Xolair as planned  Once you have restarted Xolair .1 week after Xolair injection -decrease Prednisone Take prednisone 10mg alternating with 5mg for 2 weeks then 5mg daily and hold.  follow up Dr. Alva  In 2  months and As needed    

## 2012-04-25 NOTE — Progress Notes (Signed)
Subjective:    Patient ID: [name], male    DOB: 08-Jan-1966, 46 y.o.   MRN: [id number]  HPI  46 yo [place] male for FU of severe persistent asthma difficult to control in past requiring multiple intubations.  Asthma Onset age 60 when he worked in a [employer], ETT x 6, last in 2/10, NSAID ( Advil) incuded, Allergy skin test neg, s/p nasal surgery for polyps & deviated septum, baseline prednisone 10 alternating with 5 mg, BPF > 600  CT sinuses 3/10 - widespread sinus inflammatory disease ? polyp  Admitted 12/18-12/22/11  for asthma exacerbation and sinusitis. CT sinus showed bilateral sinusitis.  Feb '12 >> RAST reviewed IgE 183, started on XOLAIR 3/12  Skin test- Positives for grass, weed and tree. 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 No nocturnal symptoms, compliant with symbicort, singulair has dropped off his list   03/25/12 [facility] follow up  Patient states much better since being out of the hospital. Does have  sob and wheezing every now and then, and cough with milky mucus. Denies chest pain and chest tightness.  Not taking symbicort-now for some time now. We discussed the importance of controller meds vs As needed  meds in asthma. Long discussion on severity of asthma flares and dangers of uncontrolled asthma. He has been admitted x 2 in last 6 months for asthma . Last 02/12/12 , tx w/ steroids and nebs.  He has assured me he will stay on controller meds and keep follow up appointment.  CT done last admission Positive for sinusitis.  Called office last week for cough and sinus congestion , tx w/ augmentin x 7 days some better but still has cough and sinus congestion .  >restarted symbicort    04/24/12 Follow up  Returns for follow up . Doing much better  Has not restarted his xolair yet.  Taking symbicort regularly.  No nocturnal symptoms  No ER visits.  Decreased Dyspnea. No wheezing.   Review of Systems  Constitutional:   No  weight loss, night sweats,  Fevers, chills, fatigue, or  lassitude.  HEENT:   No headaches,  Difficulty swallowing,  Tooth/dental problems, or  Sore throat,                No sneezing, itching, ear ache,  +nasal congestion, post nasal drip,   CV:  No chest pain,  Orthopnea, PND, swelling in lower extremities, anasarca, dizziness, palpitations, syncope.   GI  No heartburn, indigestion, abdominal pain, nausea, vomiting, diarrhea, change in bowel habits, loss of appetite, bloody stools.   Resp:    No coughing up of blood.  No change in color of mucus.  No wheezing.  No chest wall deformity  Skin: no rash or lesions.  GU: no dysuria, change in color of urine, no urgency or frequency.  No flank pain, no hematuria   MS:  No joint pain or swelling.  No decreased range of motion.  No back pain.  Psych:  No change in mood or affect. No depression or anxiety.  No memory loss.         Objective:   Physical Exam  GEN: A/Ox3; pleasant , NAD, well nourished   HEENT:  [place]/AT,  EACs-clear, TMs-wnl, NOSE-clear, THROAT-clear, no lesions, no postnasal drip or exudate noted.   NECK:  Supple w/ fair ROM; no JVD; normal carotid impulses w/o bruits; no thyromegaly or nodules palpated; no lymphadenopathy.  RESP  Clear  P & A; w/o, wheezes/ rales/ or rhonchi.no accessory muscle use, no dullness to percussion  CARD:  RRR, no m/r/g  , no peripheral edema, pulses intact, no cyanosis or clubbing.  GI:   Soft & nt; nml bowel sounds; no organomegaly or masses detected.  Musco: Warm bil, no deformities or joint swelling noted.   Neuro: alert, no focal deficits noted.    Skin: Warm, no lesions or rashes         Assessment & Plan:

## 2012-04-25 NOTE — Assessment & Plan Note (Signed)
REMAIN SYMBICORT 2 PUFFS Twice daily  -THIS IS YOUR CONTROLLER FOR YOUR ASTHMA  DO NOT MISS ANY DOSES OF THIS INHALER  TAKE ALBUTEROL INHALER ONLY AS NEEDED -THIS IS YOUR EMERGENCY INHALER.  Restart Xolair as planned  Once you have restarted Xolair .1 week after Xolair injection -decrease Prednisone Take prednisone 10mg  alternating with 5mg  for 2 weeks then 5mg  daily and hold.  follow up Dr. [name]  In 2  months and As needed

## 2012-05-06 ENCOUNTER — Telehealth: Payer: Self-pay | Admitting: Pulmonary Disease

## 2012-05-06 NOTE — Telephone Encounter (Signed)
PA form received and placed in RA's lookat to be completed.

## 2012-05-12 ENCOUNTER — Other Ambulatory Visit: Payer: Self-pay | Admitting: Pulmonary Disease

## 2012-05-12 NOTE — Telephone Encounter (Signed)
Done - given to mindy

## 2012-05-12 NOTE — Telephone Encounter (Signed)
Received approval for xolair from 04/09/12-05/09/13--ATC # listed above which is [facility]--this is where the apporval came from. Will place in RA scan folder to be scanned in pt chart

## 2012-05-16 NOTE — Telephone Encounter (Signed)
mindy---can we sign off on this message since this has been approved.

## 2012-06-11 ENCOUNTER — Other Ambulatory Visit: Payer: Self-pay | Admitting: Pulmonary Disease

## 2012-06-11 MED ORDER — VALSARTAN-HYDROCHLOROTHIAZIDE 160-25 MG PO TABS: 1.0000 | ORAL_TABLET | Freq: Every day | ORAL | Status: AC

## 2012-06-11 NOTE — Telephone Encounter (Signed)
PHARMACY REQUESTING  DIOVAN  160-25 MG <>TAKE 1 TABLET A  DAY  #30  REFILL SENT

## 2012-07-14 ENCOUNTER — Ambulatory Visit: Payer: BC Managed Care – PPO | Admitting: Pulmonary Disease

## 2012-10-03 ENCOUNTER — Emergency Department (INDEPENDENT_AMBULATORY_CARE_PROVIDER_SITE_OTHER): Payer: BC Managed Care – PPO

## 2012-10-03 ENCOUNTER — Encounter (HOSPITAL_COMMUNITY): Payer: Self-pay | Admitting: Emergency Medicine

## 2012-10-03 ENCOUNTER — Emergency Department (HOSPITAL_COMMUNITY)
Admission: EM | Admit: 2012-10-03 | Discharge: 2012-10-03 | Disposition: A | Discharge status: Home / Self Care | Payer: BC Managed Care – PPO | Source: Home / Self Care | Attending: Emergency Medicine | Admitting: Emergency Medicine

## 2012-10-03 DIAGNOSIS — J45901 Unspecified asthma with (acute) exacerbation: Secondary | ICD-10-CM

## 2012-10-03 DIAGNOSIS — J45909 Unspecified asthma, uncomplicated: Secondary | ICD-10-CM

## 2012-10-03 MED ORDER — PREDNISONE 10 MG PO TABS: ORAL_TABLET | ORAL | Status: AC

## 2012-10-03 MED ORDER — IPRATROPIUM BROMIDE 0.02 % IN SOLN
0.5000 mg | Freq: Once | RESPIRATORY_TRACT | Status: AC
  Administered 2012-10-03: 0.5 mg via RESPIRATORY_TRACT

## 2012-10-03 MED ORDER — AMOXICILLIN-POT CLAVULANATE 875-125 MG PO TABS: 1.0000 | ORAL_TABLET | Freq: Two times a day (BID) | ORAL | Status: AC

## 2012-10-03 MED ORDER — ALBUTEROL SULFATE (5 MG/ML) 0.5% IN NEBU
5.0000 mg | INHALATION_SOLUTION | Freq: Once | RESPIRATORY_TRACT | Status: AC
  Administered 2012-10-03: 5 mg via RESPIRATORY_TRACT

## 2012-10-03 MED ORDER — ALBUTEROL SULFATE (5 MG/ML) 0.5% IN NEBU
INHALATION_SOLUTION | RESPIRATORY_TRACT | Status: AC
  Filled 2012-10-03: qty 1

## 2012-10-03 MED ORDER — METHYLPREDNISOLONE SODIUM SUCC 125 MG IJ SOLR
INTRAMUSCULAR | Status: AC
  Filled 2012-10-03: qty 2

## 2012-10-03 MED ORDER — METHYLPREDNISOLONE SODIUM SUCC 125 MG IJ SOLR
125.0000 mg | Freq: Once | INTRAMUSCULAR | Status: AC
  Administered 2012-10-03: 125 mg via INTRAMUSCULAR

## 2012-10-03 NOTE — ED Provider Notes (Signed)
Chief Complaint  Patient presents with  . Asthma  . Cough  . Fever    History of Present Illness:   [name] is a 46 year old male who presents today with a seven-day history of a flareup of his asthma. He has had asthma since he was 46 years old. This has been severe at times and he has been hospitalized multiple times and intubated 7 times because of asthma, his most recent intubation was 2 years ago. He is usually maintained on prednisone 10 mg a day, Symbicort, albuterol either by MDI inhaler or by nebulizer, Singulair, Zantac, Zyrtec, and he also has some Flonase at home as well. For the past week his symptoms have been worse when she thinks is caused by an upper respiratory infection. He describes chills, feeling feverish, nasal congestion with yellow-green drainage, dry cough, sore throat, and headache which is rated a 5/10 in intensity. He's allergic to aspirin. He has high blood pressure and is on medication for that.  Review of Systems:  Other than noted above, the patient denies any of the following symptoms. Systemic:  No fever, chills, sweats, fatigue, myalgias, headache, weight loss or anorexia. Eye:  No redness, itching, or drainage. ENT:  No earache, ear congestion, nasal congestion, sneezing, rhinorrhea, sinus pressure, sinus pain, post nasal drip, or sore throat. Lungs:  No cough, sputum production, or shortness of breath. No chest pain. GI:  No indigestion, heartburn, abdominal pain, nausea, or vomiting. Skin:  No rash or itching.  PMFSH:  Past medical history, family history, social history, meds, and allergies were reviewed.  No history of allergic rhinitis.  No use of tobacco.  Physical Exam:   Vital signs:  BP 161/103  Pulse 62  Temp 97.4 F (36.3 C) (Oral)  Resp 18  SpO2 98% General:  Alert, in no distress. Eye:  No conjunctival injection or drainage. Lids were normal. ENT:  TMs and canals were normal, without erythema or inflammation.  Nasal mucosa was clear and  uncongested, without drainage.  Mucous membranes were moist.  Pharynx was clear, without exudate or drainage.  There were no oral ulcerations or lesions. Neck:  Supple, no adenopathy, tenderness or mass. Lungs:  No retractions or use of accessory muscles.  No respiratory distress.  Breath sounds were equal bilaterally and he has good air movement. He has scattered wheezes bilaterally without rales or rhonchi. Heart:  Regular rhythm, without gallops, murmers or rubs. Skin:  Clear, warm, and dry, without rash or lesions.  Radiology:  Dg Chest 2 View  10/03/2012  *RADIOLOGY REPORT*  Clinical Data: Cough  CHEST - 2 VIEW  Comparison: 02/12/2012  Findings: Cardiomediastinal silhouette is stable.  No acute infiltrate or pleural effusion.  No pulmonary edema.  Bony thorax is stable.  IMPRESSION: No active disease.   Original Report Authenticated By: Natasha Mead, M.D.     Course in Urgent Care Center:   His peak expiratory flow prior to treatment was a maximum of 300. He was given a DuoNeb breathing treatment and said Medrol 125 mg IM. He tolerated these well and felt a lot better. After treatment his lungs were clear and wheeze free with good air movement and no signs of respiratory distress. His peak expiratory flow after treatment was 450, 500, and 515.  Assessment:  The encounter diagnosis was Asthma attack.  Plan:   1.  The following meds were prescribed:   New Prescriptions   AMOXICILLIN-CLAVULANATE (AUGMENTIN) 875-125 MG PER TABLET    Take 1 tablet by  mouth 2 (two) times daily.   PREDNISONE (DELTASONE) 10 MG TABLET    6 tabs daily for 2 days, 5 tabs for 2 days, 4 tabs daily for 2 days, 3 daily for 1 day, 2 daily for 1 day, then return to usual.   2.  The patient was instructed in symptomatic care and handouts were given. 3.  The patient was told to return if becoming worse in any way, if no better in 3 or 4 days, and given some red flag symptoms that would indicate earlier return.  Follow up:  The  patient was told to follow up here, at the emergency department, or with his primary care physician if he should become worse in any way.     Reuben Likes, MD 10/03/12 2107

## 2012-10-03 NOTE — ED Notes (Signed)
C/O cold sxs with chills/feeling feverish, yellow-green nasal drainage, dry cough, HA, and sore throat x 1 wk.  Has been using normal inhaler and nebulizer.

## 2012-10-03 NOTE — ED Notes (Signed)
Breathing treatment in progress

## 2012-11-05 ENCOUNTER — Telehealth: Payer: Self-pay | Admitting: Pulmonary Disease

## 2012-11-06 MED ORDER — ALBUTEROL SULFATE (2.5 MG/3ML) 0.083% IN NEBU: 2.5000 mg | INHALATION_SOLUTION | Freq: Four times a day (QID) | RESPIRATORY_TRACT | Status: AC | PRN

## 2012-11-11 ENCOUNTER — Other Ambulatory Visit: Payer: Self-pay | Admitting: Pulmonary Disease

## 2012-11-11 ENCOUNTER — Other Ambulatory Visit (HOSPITAL_COMMUNITY): Payer: Self-pay | Admitting: Internal Medicine

## 2012-12-17 ENCOUNTER — Other Ambulatory Visit: Payer: Self-pay | Admitting: Internal Medicine

## 2013-01-13 ENCOUNTER — Other Ambulatory Visit: Payer: Self-pay | Admitting: Pulmonary Disease

## 2013-01-15 ENCOUNTER — Telehealth: Payer: Self-pay | Admitting: Pulmonary Disease

## 2013-01-15 MED ORDER — PREDNISONE 10 MG PO TABS: 10.0000 mg | ORAL_TABLET | Freq: Every day | ORAL | Status: AC

## 2013-01-15 NOTE — Telephone Encounter (Signed)
Called and spoke with pt and he stated that he has relocated to [place] and he is out of his prednisone.  i sent in refill of this and he stated that he will call back to set up appt. Nothing further is needed.

## 2013-03-12 ENCOUNTER — Telehealth: Payer: Self-pay | Admitting: Pulmonary Disease

## 2013-03-12 NOTE — Telephone Encounter (Signed)
Pl call him  & schedule oV with TP or me

## 2013-03-12 NOTE — Telephone Encounter (Signed)
Pt received last xolair shot 03/13/12 pt has not come back in since then   Per 04/24/12 ov with TP REMAIN SYMBICORT 2 PUFFS Twice daily -THIS IS YOUR CONTROLLER FOR YOUR ASTHMA  DO NOT MISS ANY DOSES OF THIS INHALER  TAKE ALBUTEROL INHALER ONLY AS NEEDED -THIS IS YOUR EMERGENCY INHALER.  Restart Xolair as planned  Once you have restarted Xolair .1 week after Xolair injection -decrease Prednisone  Take prednisone 10mg  alternating with 5mg  for 2 weeks then 5mg  daily and hold.  follow up Dr. [name] In 2 months and As needed   Just Fyi for RA

## 2013-03-17 ENCOUNTER — Encounter: Payer: Self-pay | Admitting: *Deleted

## 2013-03-17 NOTE — Telephone Encounter (Addendum)
lmomtcb x1 for pt will also send pt a letter.

## 2013-07-16 IMAGING — CR DG CHEST 2V
3 series · 3 of 3 positions shown · non-contrast
Comparison: 02/12/2012

CLINICAL DATA: Cough

CHEST - 2 VIEW

[view not recorded (1 of 3)]
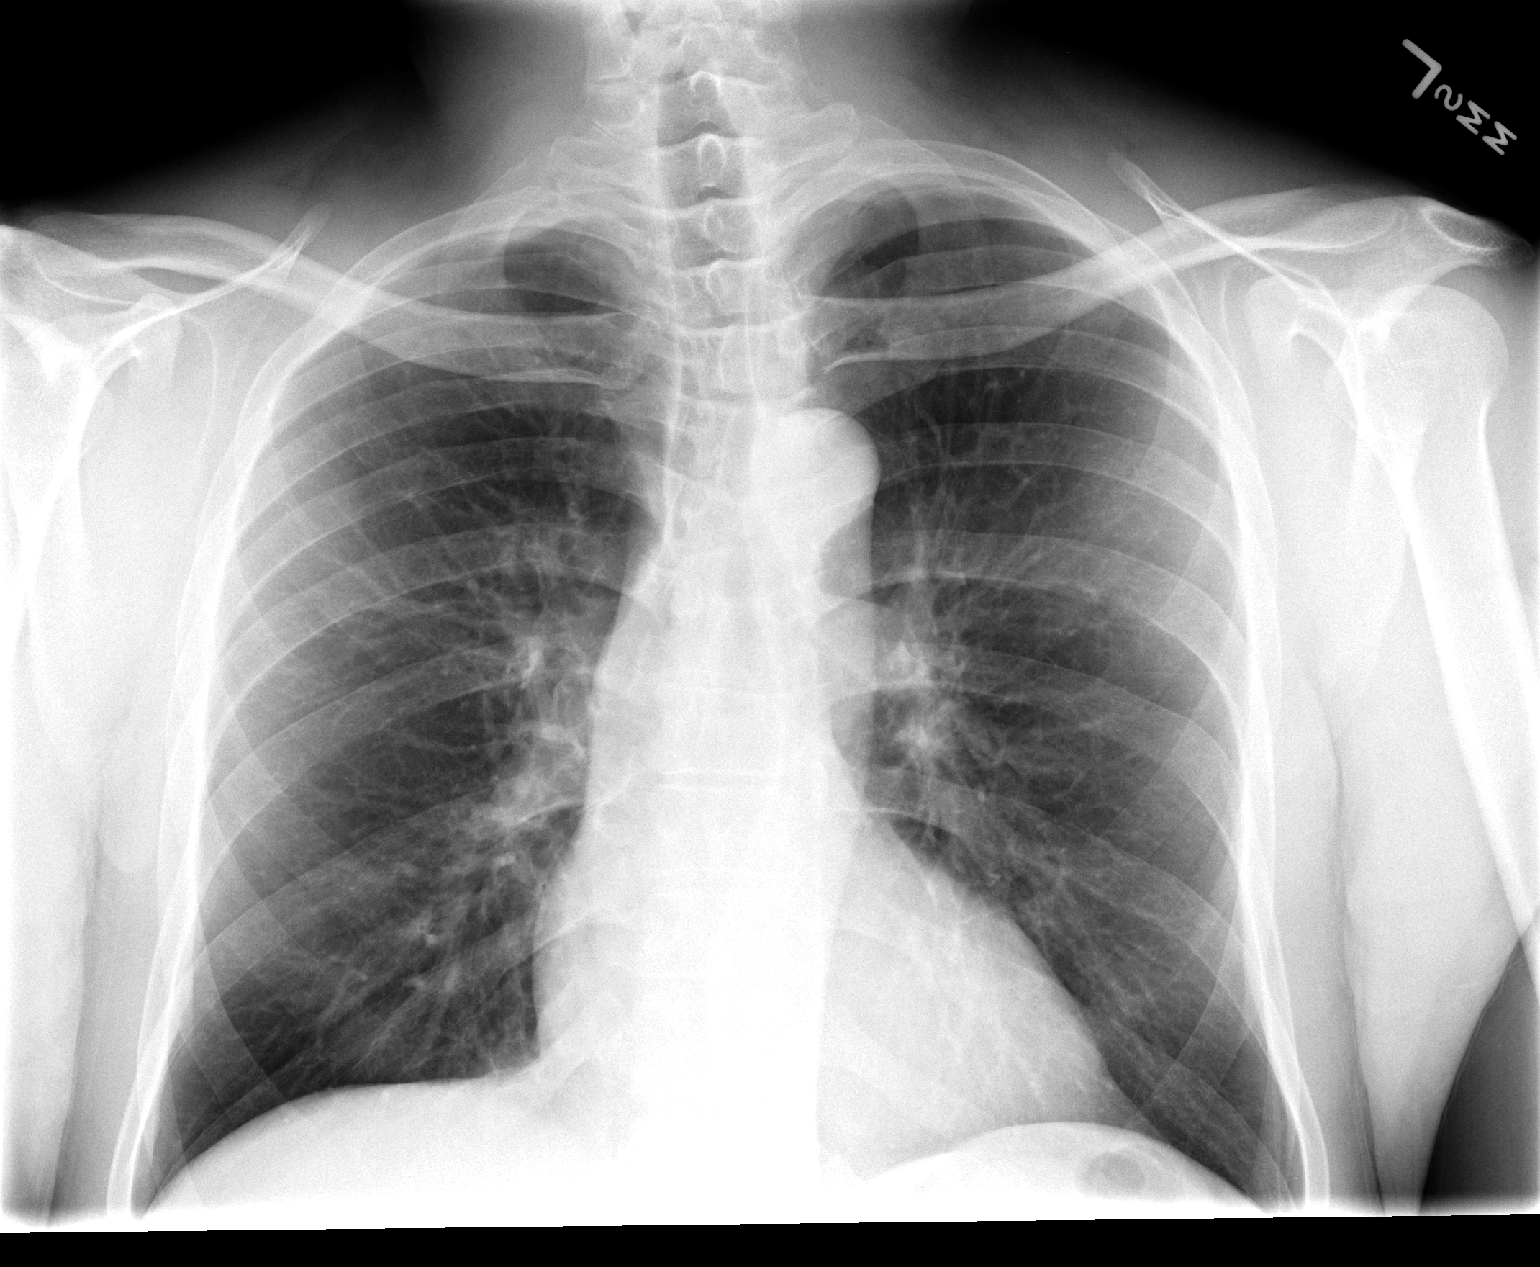

[view not recorded (2 of 3)]
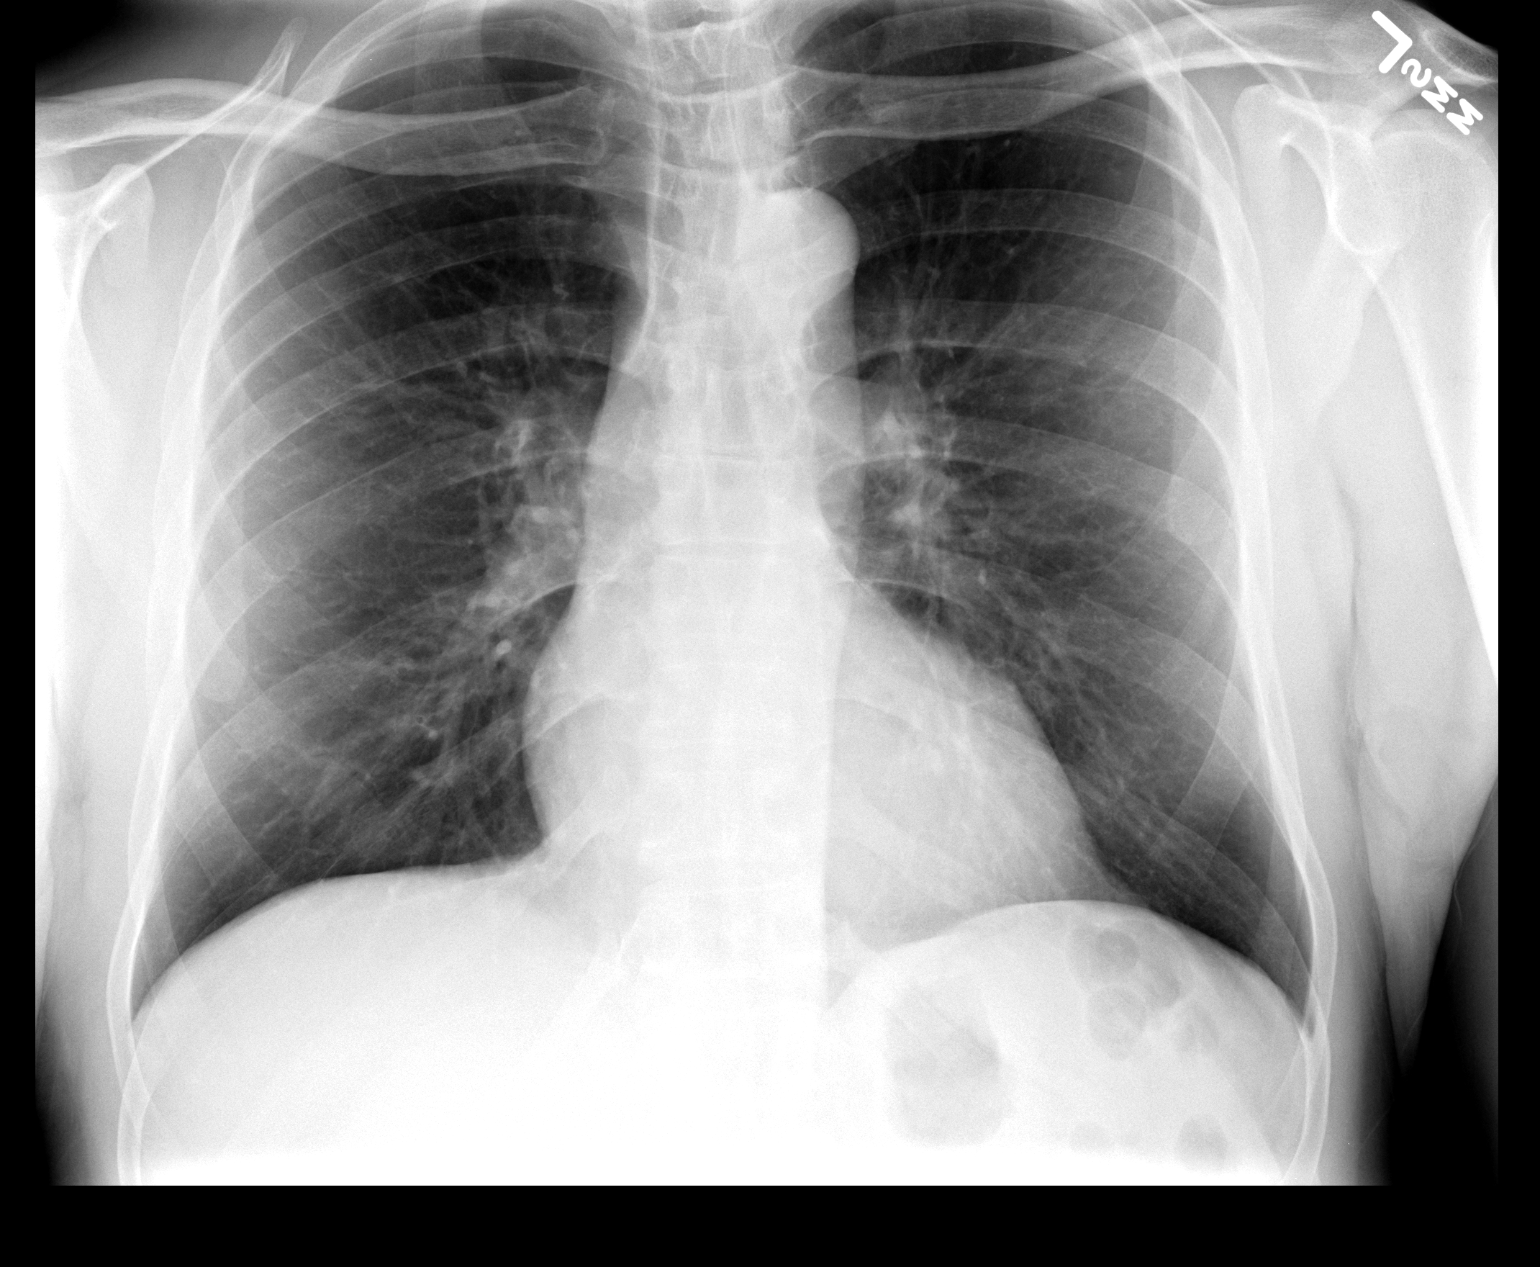

[view not recorded (3 of 3)]
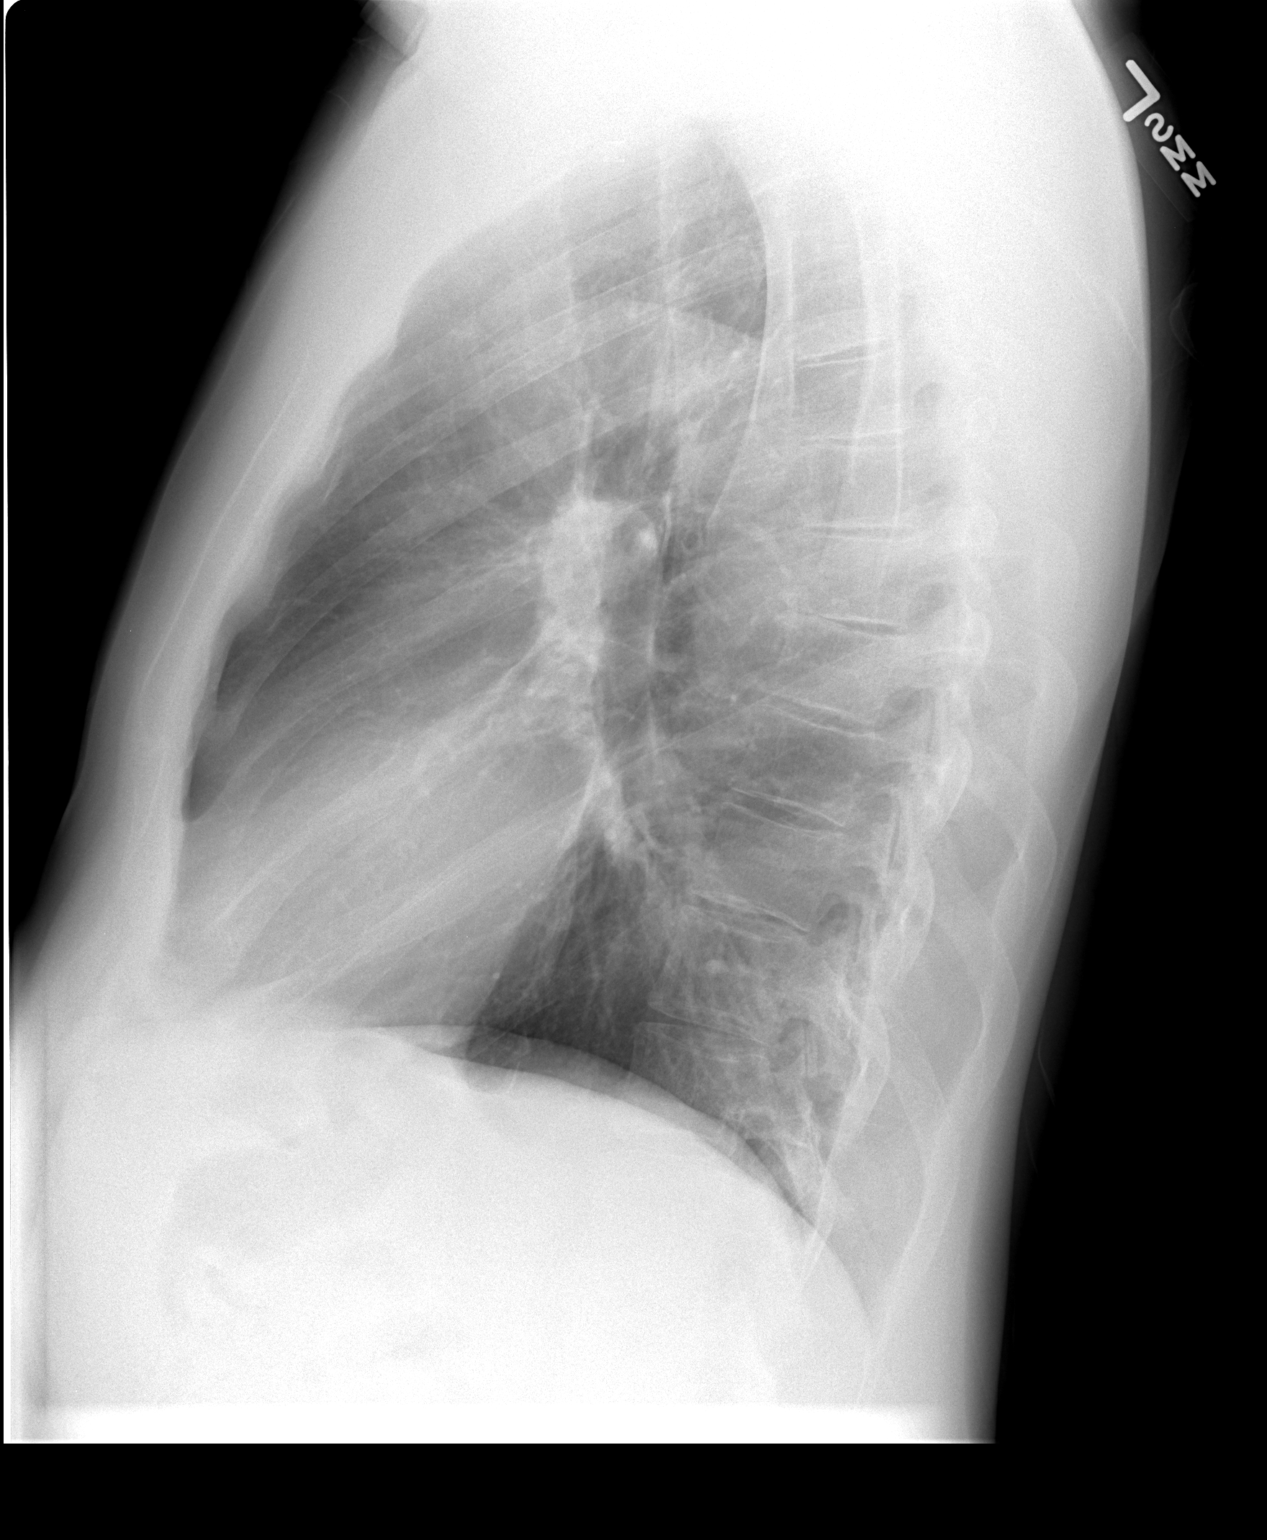

[3 of 3 positions shown; findings below may reference images not displayed]

FINDINGS: Cardiomediastinal silhouette is stable.  No acute
infiltrate or pleural effusion.  No pulmonary edema.  Bony thorax
is stable.
IMPRESSION: No active disease.

## 2013-10-30 ENCOUNTER — Encounter: Payer: Self-pay | Admitting: Pulmonary Disease

## 2015-03-21 ENCOUNTER — Encounter: Payer: Self-pay | Admitting: Internal Medicine

## 2021-12-13 NOTE — Progress Notes (Deleted)
12/14/21- 38 yoM former smoker needing Pulmonary Surgical Clearance pending Robotic Radical Prostatectomy (Dr Anibal Henderson) Medical problem list includes HTN, Chronic Sinusitis, Nasal Polyps, Asthma/ ASA [facility], , History Acute Respiratory Failure, GERD, Hosp for asthma exacer 2013- multiple hosp 2013 back to 2003 -Nebulizer albuterol, Singulair, Symbicort 160, albuterol HFA, Lab- 1/12- Eos 0.77 H,  Mark chart "difficult airway"  Covid vax- Flu vax-  Allergy shots 2013 [facility] ENT CT sinuses 2021+ nasal polyps. Mucosal thickening, surgical changes

## 2021-12-14 ENCOUNTER — Institutional Professional Consult (permissible substitution): Payer: BC Managed Care – PPO | Admitting: Internal Medicine
